# Patient Record
Sex: Male | Born: 1990 | Race: White | Hispanic: No | Marital: Single | Smoking: Former smoker
Health system: Southern US, Community
[De-identification: ages and names within clinical notes are randomized; demographics above are authoritative.]

## PROBLEM LIST (undated history)

## (undated) DIAGNOSIS — J45909 Unspecified asthma, uncomplicated: Secondary | ICD-10-CM

## (undated) DIAGNOSIS — J189 Pneumonia, unspecified organism: Secondary | ICD-10-CM

## (undated) HISTORY — PX: CHOLECYSTECTOMY: SHX55

## (undated) HISTORY — PX: OTHER SURGICAL HISTORY: SHX169

---
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
## 2008-01-19 ENCOUNTER — Encounter: Payer: Self-pay | Admitting: Family Medicine

## 2008-01-19 DIAGNOSIS — F909 Attention-deficit hyperactivity disorder, unspecified type: Secondary | ICD-10-CM | POA: Insufficient documentation

## 2008-01-19 DIAGNOSIS — D580 Hereditary spherocytosis: Secondary | ICD-10-CM

## 2008-01-19 DIAGNOSIS — L259 Unspecified contact dermatitis, unspecified cause: Secondary | ICD-10-CM

## 2008-01-20 ENCOUNTER — Ambulatory Visit: Payer: Self-pay | Admitting: Family Medicine

## 2008-01-20 DIAGNOSIS — J45909 Unspecified asthma, uncomplicated: Secondary | ICD-10-CM | POA: Insufficient documentation

## 2008-01-20 LAB — CONVERTED CEMR LAB
Bilirubin Urine: NEGATIVE
Blood in Urine, dipstick: NEGATIVE
Glucose, Urine, Semiquant: NEGATIVE
Ketones, urine, test strip: NEGATIVE
Nitrite: NEGATIVE
Protein, U semiquant: NEGATIVE
Specific Gravity, Urine: 1.015
Urobilinogen, UA: NEGATIVE
WBC Urine, dipstick: NEGATIVE
pH: 6.5

## 2009-03-29 ENCOUNTER — Ambulatory Visit: Payer: Self-pay | Admitting: Family Medicine

## 2009-03-29 DIAGNOSIS — H6690 Otitis media, unspecified, unspecified ear: Secondary | ICD-10-CM | POA: Insufficient documentation

## 2009-04-07 ENCOUNTER — Emergency Department (HOSPITAL_COMMUNITY): Admission: EM | Admit: 2009-04-07 | Discharge: 2009-04-07 | Payer: Self-pay | Admitting: Emergency Medicine

## 2010-02-17 IMAGING — CR DG ANKLE COMPLETE 3+V*L*
3 series · 3 of 3 positions shown · non-contrast
Comparison: None.

CLINICAL DATA: Lateral left ankle pain following injury.

LEFT ANKLE COMPLETE - 3+ VIEW

[t ankle joint ap left]
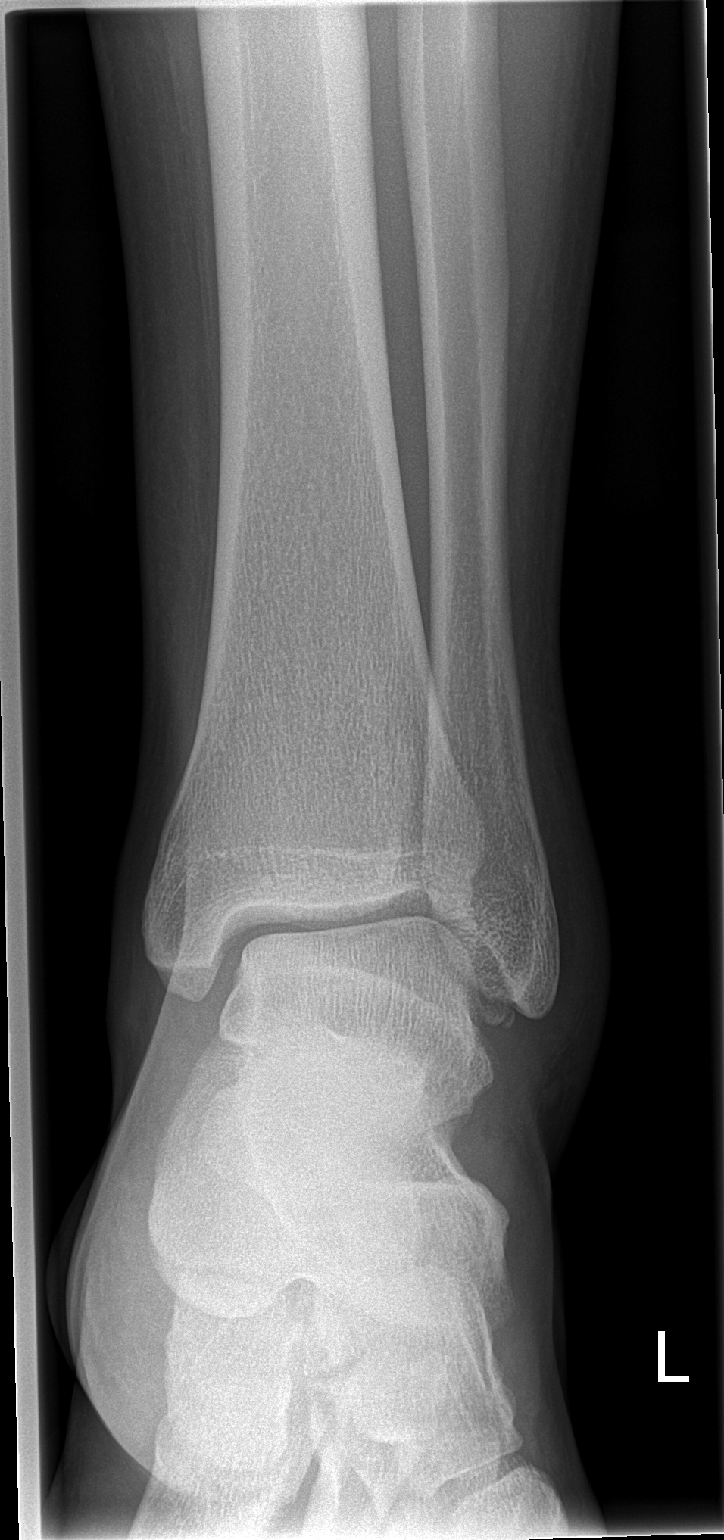

[t ankle joint oblique left]
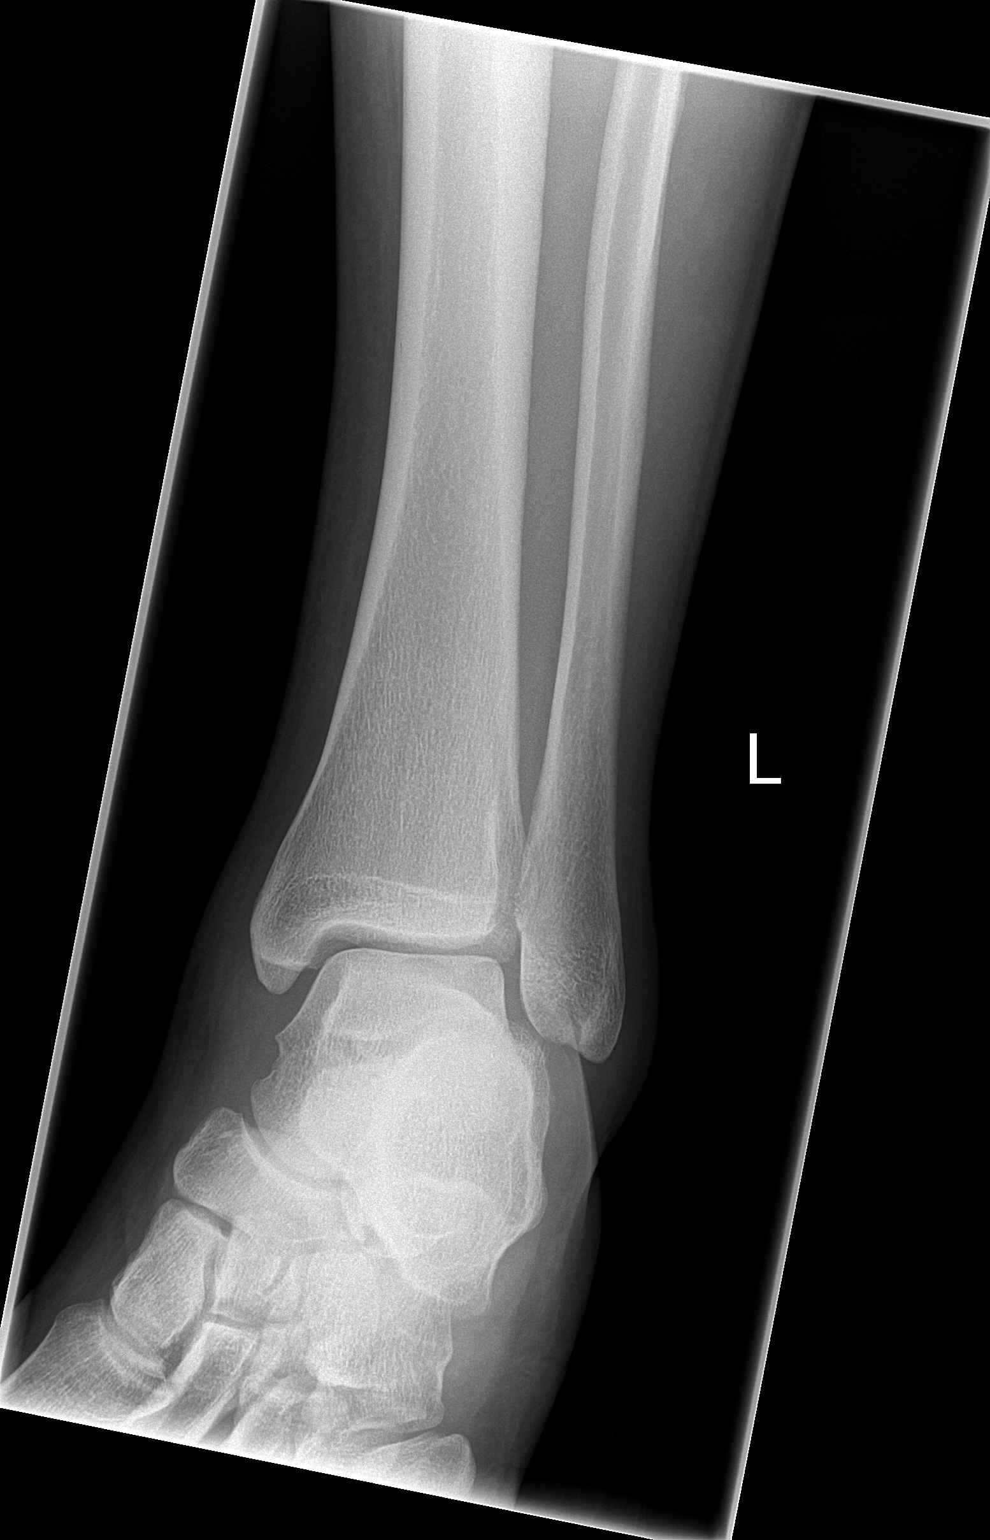

[t ankle joint lat left]
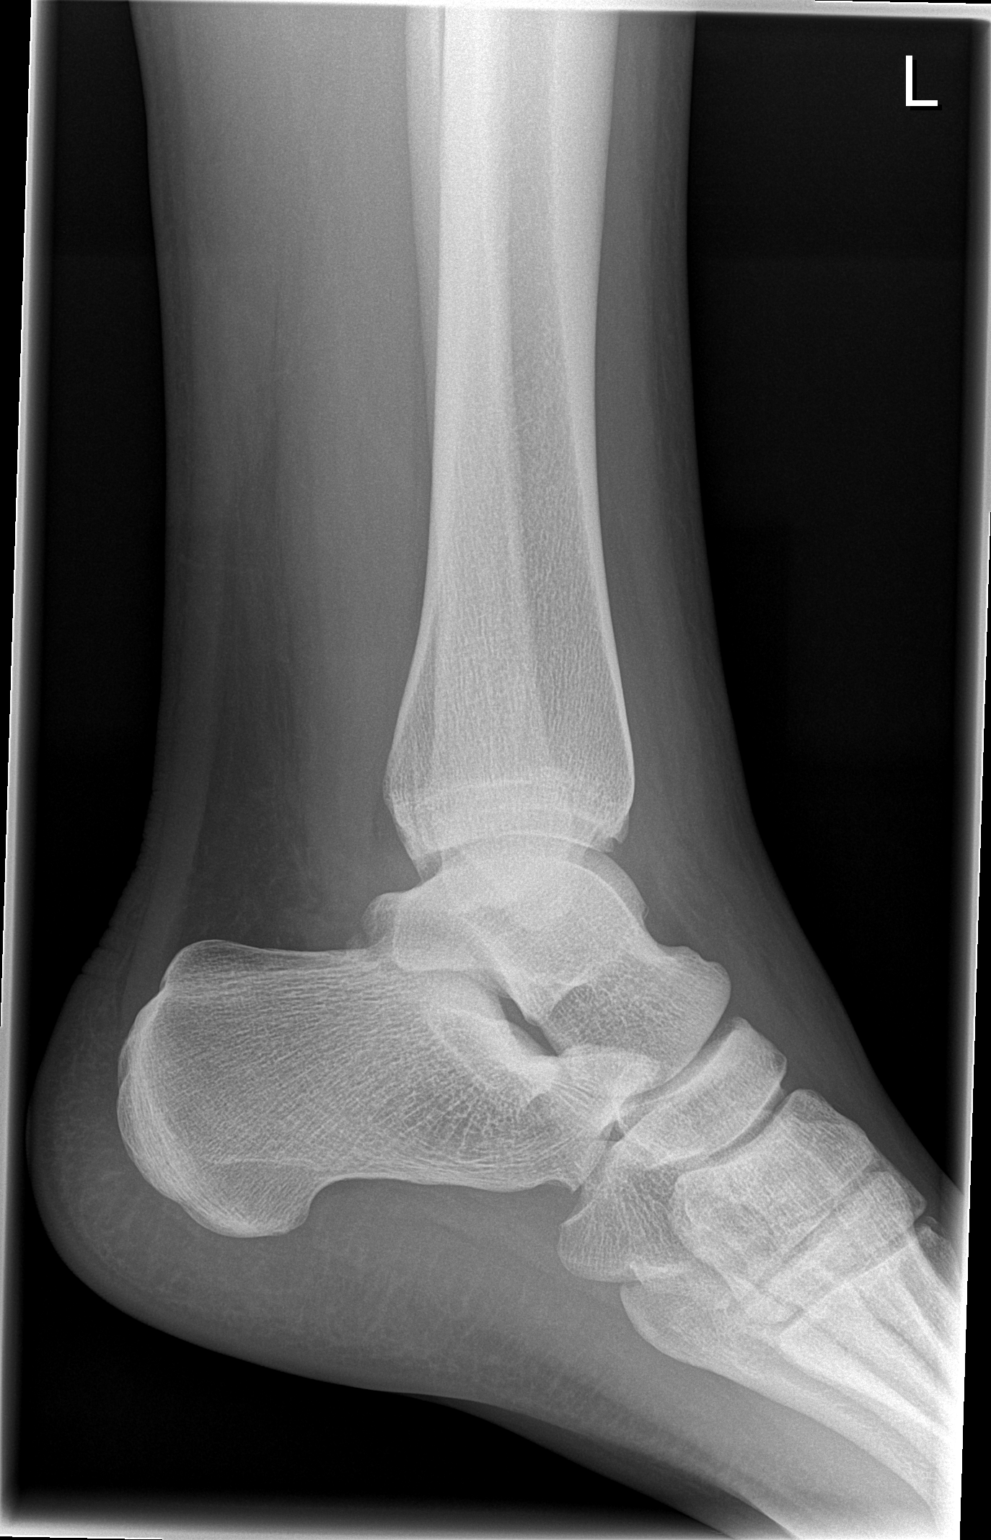

[3 of 3 positions shown; findings below may reference images not displayed]

FINDINGS: Small avulsion fracture off the distal aspect of the
lateral malleolus.  Lateral soft tissue swelling.  No definite
effusion.
IMPRESSION: Small avulsion fracture off the distal aspect of the lateral
malleolus.

## 2010-08-05 ENCOUNTER — Emergency Department (HOSPITAL_COMMUNITY): Admission: EM | Admit: 2010-08-05 | Discharge: 2010-08-06 | Payer: Self-pay | Admitting: Emergency Medicine

## 2010-08-06 ENCOUNTER — Telehealth: Payer: Self-pay | Admitting: Family Medicine

## 2010-12-31 NOTE — Progress Notes (Signed)
Summary: call a nurse  Phone Note Call from Patient   Summary of Call: Triage Record Num: [id number] Operator: Sheryn Bison Patient Name: Ripken Rekowski Call Date & Time: 08/05/2010 7:36:54PM Patient Phone: [phone] PCP: Patient Gender: Male PCP Fax : Patient DOB: 02/27/1991 Practice Name: [facility] Reason for Call: Pt is calling about experiencing a stiff neck, weakness, aching and the left palm of his hand is swollen. No fever. Rn advised ED. Protocol(s) Used: Neck Pain Or Stiffness (Pediatric) Recommended Outcome per Protocol: See ED Immediately Reason for Outcome: Weakness (loss of strength) in the arms or legs Care Advice:  ~ CARE ADVICE given per Neck Pain or Stiffness (Pediatric) guideline. GO TO ED NOW Your child needs to be seen within the next hour. Go to the [facility] at _[facility] Hospital. Leave as soon as you can.  ~ 08/05/2010 7:44:04PM Page 1 of 1 [id number] Initial call taken by: Melody Comas,  August 06, 2010 10:50 AM

## 2011-02-13 ENCOUNTER — Encounter (INDEPENDENT_AMBULATORY_CARE_PROVIDER_SITE_OTHER): Payer: Self-pay | Admitting: *Deleted

## 2011-02-13 ENCOUNTER — Encounter: Payer: Self-pay | Admitting: Family Medicine

## 2011-02-13 ENCOUNTER — Ambulatory Visit (INDEPENDENT_AMBULATORY_CARE_PROVIDER_SITE_OTHER): Payer: BC Managed Care – PPO | Admitting: Family Medicine

## 2011-02-13 DIAGNOSIS — H60339 Swimmer's ear, unspecified ear: Secondary | ICD-10-CM

## 2011-02-13 DIAGNOSIS — R519 Headache, unspecified: Secondary | ICD-10-CM | POA: Insufficient documentation

## 2011-02-13 DIAGNOSIS — R51 Headache: Secondary | ICD-10-CM

## 2011-02-13 DIAGNOSIS — H66019 Acute suppurative otitis media with spontaneous rupture of ear drum, unspecified ear: Secondary | ICD-10-CM

## 2011-02-18 NOTE — Letter (Signed)
Summary: Out of Work  [facility]  [address] [place], [place] [address]   Phone: [phone]  Fax: [phone]    February 13, 2011   Employee:  Frank Bell    To Whom It May Concern:   For Medical reasons, please excuse the above named employee from work for the following dates:  Start:   February 12 2011    End:   May return to work on March 19th 2012  If you need additional information, please feel free to contact our office.         Sincerely,  Hannah Beat MD

## 2011-02-18 NOTE — Assessment & Plan Note (Signed)
Summary: EAR/CLE  [employer]   Vital Signs:  Patient profile:   20 year old male Height:      75 inches Weight:      267.25 pounds BMI:     33.52 Temp:     97.9 degrees F oral Pulse rate:   64 / minute Pulse rhythm:   regular BP sitting:   110 / 60  (left arm) Cuff size:   large  Vitals Entered By: Benny Lennert CMA Duncan Dull) (February 13, 2011 8:04 AM)  History of Present Illness: Chief complaint ear ache and headache  20 year old male:  OM, OE: Having some ear problems, right ear has historically had some problems and right ear has been causing him a lot of probs. He has had some drainage and signficant pain from the R > L ear. Has had drainage from his R ear in the past, too. Taken some tylenol.   Couple weeks ago, had a cold also has daily stuffy noses  HA: Normally does not have HA Now with bad HA, had to miss work yesterday no n/v/d. no photophobia or phonophobia.  Allergies (verified): No Known Drug Allergies  Past History:  Past medical, surgical, family and social histories (including risk factors) reviewed, and no changes noted (except as noted below).  Past Medical History: Reviewed history from 01/20/2008 and no changes required. 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 Cholecystectomy Splenectomy ear tubes placed age 8   Family History: Reviewed history from 01/19/2008 and no changes required. Father: Alive 13, healthy, psoriasis Mother: Alive 20, HTN Siblings: 2 brothers, 63 and 74 YOA MGG:  CVA, CABG CV:  (+)  HBP:  (+) strong family hx. Arthritis:  (+) Pancreatic CA:  MGM  Social History: Reviewed history from 01/20/2008 and no changes required. 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 CV: No chest pain or SOB GI: No noted N or V Otherwise, pertinent positives and negatives are noted in the HPI.   Physical Exam  General:  Well-developed,well-nourished,in no acute distress; alert,appropriate and cooperative throughout examination Head:  Normocephalic and atraumatic without obvious abnormalities. No apparent alopecia or balding. Ears:  R TM, small perf, cloudy, yellowish, cannot see landmarks. R ear painful when moved and tragus painful when moved. L ear with serous fluid Nose:  no external deformity.   Mouth:  Oral mucosa and oropharynx without lesions or exudates.  Teeth in good repair. Neck:  No deformities, masses, or tenderness noted. Lungs:  Normal respiratory effort, chest expands symmetrically. Lungs are clear to auscultation, no crackles or wheezes. Heart:  Normal rate and regular rhythm. S1 and S2 normal without gallop, murmur, click, rub or other extra sounds. Extremities:  No clubbing, cyanosis, edema, or deformity noted with normal full range of motion of all joints.   Neurologic:  alert & oriented X3 and gait normal.   Cervical Nodes:  + ant LAD Psych:  Cognition and judgment appear intact. Alert and cooperative with normal attention span and concentration. No apparent delusions, illusions, hallucinations   Impression & Recommendations:  Problem # 1:  OTITIS MEDIA, ACUTE WITH RUPTURE OF TYMPANIC MEMBRANE (ICD-382.01) Assessment New bad OM, perf likely old high dose amox with floxin otic -- suspect OE as well by exam  The following medications were removed from the medication list:    Amoxicillin 500 Mg Tabs (Amoxicillin) .Marland Kitchen... 2 tab by mouth two times a day x 10 days His updated medication list for this problem includes:    Amoxicillin  500 Mg Tabs (Amoxicillin) .Marland KitchenMarland KitchenMarland KitchenMarland Kitchen 3 tabs by mouth two times a day (high dose om)  Problem # 2:  OTITIS EXTERNA, ACUTE, RIGHT (ICD-380.12) Assessment: New  Problem # 3:  HEADACHE (ICD-784.0) most likely related to #1  His updated medication list for this problem includes:    Butalbital-apap-caffeine 50-325-40 Mg Tabs (Butalbital-apap-caffeine) .Marland Kitchen... 1 - 2 by mouth q 4 hours as needed headache (max 6 a day)  Complete Medication List: 1)  Ofloxacin 0.3 % Soln (Ofloxacin) .Marland Kitchen.. 10 drops each day for 10 days 2)  Amoxicillin 500 Mg Tabs (Amoxicillin) .... 3 tabs by mouth two times a day (high dose om) 3)  Butalbital-apap-caffeine 50-325-40 Mg Tabs (Butalbital-apap-caffeine) .Marland Kitchen.. 1 - 2 by mouth q 4 hours as needed headache (max 6 a day) Prescriptions: BUTALBITAL-APAP-CAFFEINE 50-325-40 MG TABS (BUTALBITAL-APAP-CAFFEINE) 1 - 2 by mouth q 4 hours as needed headache (max 6 a day)  #30 x 0   Entered and Authorized by:   Hannah Beat MD   Signed by:   Hannah Beat MD on 02/13/2011   Method used:   Print then Give to Patient   RxID:   [id number] AMOXICILLIN 500 MG TABS (AMOXICILLIN) 3 tabs by mouth two times a day (high dose OM)  #60 x 0   Entered and Authorized by:   Hannah Beat MD   Signed by:   Hannah Beat MD on 02/13/2011   Method used:   Print then Give to Patient   RxID:   [id number] OFLOXACIN 0.3 % SOLN (OFLOXACIN) 10 drops each day for 10 days  #1 x 0   Entered and Authorized by:   Hannah Beat MD   Signed by:   Hannah Beat MD on 02/13/2011   Method used:   Print then Give to Patient   RxID:   [id number]    Orders Added: 1)  Est. Patient Level IV [53664]    Prior Medications (reviewed today): None Current Allergies (reviewed today): No known allergies

## 2011-04-18 NOTE — Assessment & Plan Note (Signed)
[facility]                             [place] OFFICE NOTE   Frank Bell, Frank Bell                     MRN:          [id number]  DATE:06/29/2006                            DOB:          Feb 20, 1991    CHIEF COMPLAINT:  A 20 year old white male here to establish a new doctor.   HISTORY OF PRESENT ILLNESS:  Frank Bell has been having three to four days  of fever, severe sore throat, swollen glands, increased mucus production.  He also says he has some right ear pain and right external neck pain.  He  denies cough or shortness of breath.  He is having difficulty swallowing.  He does have a history of occasional nose bleeds.   PAST MEDICAL HISTORY:  1.  Hereditary xerocytosis.  2.  Childhood asthma, now resolved.  3.  Tobacco abuse, dips tobacco.  4.  Eczema.   HOSPITALIZATIONS AND SURGERIES:  1.  Cholecystectomy.  2.  Splenectomy.   ALLERGIES:  None.   MEDICATIONS:  None.   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  He  drives bulldozers.  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  He is not married and does not have any  children.  He occasionally gets exercise in the form of swimming.  For his  diet, he mainly eats fast food with only occasional vegetables and skips  lunch frequently.  He has past history of tobacco abuse, one and a half  packs per day, but stopped and replaced it with dipping.  He dips tobacco  once daily. He denies alcohol and IV drug use.   FAMILY HISTORY:  Father alive at age 43, healthy but with psoriasis.  Mother  age 62 with hypertension.  Maternal grandmother passed away from CVA and  also had coronary artery bypass graft.  Frank Bell has two brothers, one who is  27 and one who is 66 years old.  His younger brother had gastroschisis as a  child.  There is a strong family history for cardiovascular disease,  hypertension, and diabetes, as  well as arthritis.  His maternal grandmother  also had pancreatic cancer.   IMMUNIZATIONS:  As far as his mother knows, he is up to date on  immunizations, although she does not think he has received Pneumovax or  meningococcal vaccine.   PHYSICAL EXAMINATION:  VITAL SIGNS:  Stable with slight elevation in  temperature at 100.1.  GENERAL:  Overweight-appearing male in no apparent distress.  Nontoxic  appearing.  No increased work of breathing.  HEENT:  PERRLA.  Extraocular muscles intact.  Tympanic membranes bilaterally  scarred and nonmobile.  No clear exudate or erythema bilaterally.  Nares  patent.  Oropharynx:  Enlarged tonsils with large crypts bilaterally.  Right  tonsil larger than left with uvular deviation toward the right.  Lymphadenopathy bilaterally but right significantly larger than left.  The  patient is talking with somewhat a rounded voice.  CARDIOVASCULAR:  Regular rate and rhythm.  No murmurs, rubs,  or gallops.  PULMONARY:  Clear to auscultation bilaterally.  No wheezes, rales, or  rhonchi.   ASSESSMENT AND PLAN:  Concern for peritonsillar abscess on the right.  Discussed the patient's case and history of splenectomy and hereditary  xerocytosis.  The patient was sent to ears, nose and throat doctor for an  appointment today at 1 p.m.  Before leaving, we obtained a CBC with diff,  mono spot, rapid strep, and throat culture.  He will follow up with me after  seeing Dr. Dorma Bell p.r.n.  We did discuss that at his leisure, he can return  to be considered for meningococcal and Pneumovax vaccines, as well as to  receive influenza vaccine in the indicated season.                                   Kerby Nora, MD   AB/MedQ  DD:  06/29/2006  DT:  06/30/2006  Job #:  [id number]

## 2011-07-16 ENCOUNTER — Inpatient Hospital Stay (INDEPENDENT_AMBULATORY_CARE_PROVIDER_SITE_OTHER)
Admission: RE | Admit: 2011-07-16 | Discharge: 2011-07-16 | Disposition: A | Discharge status: Home / Self Care | Payer: Self-pay | Source: Ambulatory Visit | Attending: Emergency Medicine | Admitting: Emergency Medicine

## 2011-07-16 DIAGNOSIS — S90929A Unspecified superficial injury of unspecified foot, initial encounter: Secondary | ICD-10-CM

## 2011-07-21 ENCOUNTER — Encounter: Payer: Self-pay | Admitting: Family Medicine

## 2011-07-21 DIAGNOSIS — Z029 Encounter for administrative examinations, unspecified: Secondary | ICD-10-CM

## 2011-07-24 ENCOUNTER — Encounter: Payer: Self-pay | Admitting: Family Medicine

## 2013-07-26 ENCOUNTER — Emergency Department (HOSPITAL_COMMUNITY)
Admission: EM | Admit: 2013-07-26 | Discharge: 2013-07-26 | Disposition: A | Discharge status: Home / Self Care | Payer: BC Managed Care – PPO | Attending: Emergency Medicine | Admitting: Emergency Medicine

## 2013-07-26 ENCOUNTER — Encounter: Payer: Self-pay | Admitting: Family Medicine

## 2013-07-26 ENCOUNTER — Ambulatory Visit (INDEPENDENT_AMBULATORY_CARE_PROVIDER_SITE_OTHER): Payer: BC Managed Care – PPO | Admitting: Family Medicine

## 2013-07-26 ENCOUNTER — Emergency Department (HOSPITAL_COMMUNITY): Payer: BC Managed Care – PPO

## 2013-07-26 ENCOUNTER — Encounter (HOSPITAL_COMMUNITY): Payer: Self-pay | Admitting: Emergency Medicine

## 2013-07-26 VITALS — BP 108/72 | HR 56 | Temp 97.8°F | Ht 75.0 in | Wt 270.2 lb

## 2013-07-26 DIAGNOSIS — M545 Low back pain, unspecified: Secondary | ICD-10-CM | POA: Insufficient documentation

## 2013-07-26 DIAGNOSIS — Z87891 Personal history of nicotine dependence: Secondary | ICD-10-CM | POA: Insufficient documentation

## 2013-07-26 DIAGNOSIS — J45909 Unspecified asthma, uncomplicated: Secondary | ICD-10-CM | POA: Insufficient documentation

## 2013-07-26 DIAGNOSIS — M549 Dorsalgia, unspecified: Secondary | ICD-10-CM

## 2013-07-26 HISTORY — DX: Unspecified asthma, uncomplicated: J45.909

## 2013-07-26 MED ORDER — TRAMADOL HCL 50 MG PO TABS: 50.0000 mg | ORAL_TABLET | Freq: Three times a day (TID) | ORAL | Status: DC | PRN

## 2013-07-26 MED ORDER — CYCLOBENZAPRINE HCL 10 MG PO TABS: 10.0000 mg | ORAL_TABLET | Freq: Three times a day (TID) | ORAL | Status: DC | PRN

## 2013-07-26 NOTE — Progress Notes (Signed)
Subjective:    Patient ID: Frank Bell, male    DOB: 12/15/1990, 22 y.o.   MRN: [id number]  HPI Here with back pain - for over 2 weeks    L lower back back pain - dull throughout the day and gets sharp with certain movements  Pain does not shoot down his leg  Recommended follow up for this  He tries different positions / and cannot sleep well due to pain   Has flexeril  Mother gave him some of her percocet -- 2-3 per day (has done that on and off for past week or so)  Takes ibuprofen 800 mg three times per day-not much help from that   He had some left over hydrocodone from a fx ankle in the past -- but they make him nauseated   Only thing that gets him through work is percocet  He just wants pain medicine at this time= and thinks this will get better on its own  Works in a marine shop lot of manual labor    Was seen in the ER for this was there at 2:30 am  Had xray -upon review it is normal Pt states the ER doctor told him he had a bulging disc with a pinched nerve and he needed to get pain med from his PCP    Patient Active Problem List   Diagnosis Date Noted  . OTITIS EXTERNA, ACUTE, RIGHT 02/13/2011  . OTITIS MEDIA, ACUTE WITH RUPTURE OF TYMPANIC MEMBRANE 02/13/2011  . HEADACHE 02/13/2011  . LOM 03/29/2009  . ASTHMA 01/20/2008  . 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 ADHD 01/19/2008  . ECZEMA 01/19/2008   Past Medical History  Diagnosis Date  . Asthma    Past Surgical History  Procedure Laterality Date  . Cholecystectomy    . Spleenectomy     History  Substance Use Topics  . Smoking status: Former Games developer  . Smokeless tobacco: Not on file  . Alcohol Use: Yes     Comment: occ   No family history on file. No Known Allergies Current Outpatient Prescriptions on File Prior to Visit  Medication Sig Dispense Refill  . cyclobenzaprine (FLEXERIL) 10 MG tablet Take 1 tablet (10 mg total) by mouth 3 (three) times daily as needed for muscle spasms.  30  tablet  0   No current facility-administered medications on file prior to visit.      Review of Systems Review of Systems  Constitutional: Negative for fever, appetite change, fatigue and unexpected weight change.  Eyes: Negative for pain and visual disturbance.  Respiratory: Negative for cough and shortness of breath.   Cardiovascular: Negative for cp or palpitations    Gastrointestinal: Negative for nausea, diarrhea and constipation.  Genitourinary: Negative for urgency and frequency. neg for blood in urine or dysuria  MSK pos for lumbar pain without radiation Skin: Negative for pallor or rash   Neurological: Negative for weakness, light-headedness, numbness and headaches.  Hematological: Negative for adenopathy. Does not bruise/bleed easily.  Psychiatric/Behavioral: Negative for dysphoric mood. The patient is not nervous/anxious.         Objective:   Physical Exam  Constitutional: He appears well-developed and well-nourished. No distress.  overwt and well appearing   HENT:  Head: Normocephalic and atraumatic.  Eyes: Conjunctivae and EOM are normal. Pupils are equal, round, and reactive to light.  Neck: Normal range of motion. Neck supple.  Cardiovascular: Normal rate and regular rhythm.   Pulmonary/Chest: Effort normal and breath sounds normal.  Musculoskeletal:  He exhibits tenderness. He exhibits no edema.       Lumbar back: He exhibits decreased range of motion, tenderness and spasm. He exhibits no bony tenderness, no swelling, no edema and no deformity.  Tender in L lumbar musculature and SI area  No bony tenderness Neg SLR Nl rom hips LS flex 30 deg and ext 10 deg with pain  Some pain on lateral bend and twist   No neuol signs   Lymphadenopathy:    He has no cervical adenopathy.  Neurological: He is alert. He has normal reflexes. He displays no atrophy. No sensory deficit. He exhibits normal muscle tone. Coordination and gait normal.  Skin: Skin is warm and dry. No  rash noted. No erythema. No pallor.  Psychiatric: He has a normal mood and affect.          Assessment & Plan:

## 2013-07-26 NOTE — ED Notes (Addendum)
Pt states he has had back pain x2 weeks. Pt reports he cannot determine a single event that caused the pain. Pt reports his pain is in the lumbar area of the spine, but more on the left lower side of his back. Pt has been taking vicodin and percocet from previous prescriptions not related to his back for his pain. Pt reports experiencing issues with mobility when he has the pain. Pt describes the pain as sharp, but does not radiate down his leg.

## 2013-07-26 NOTE — ED Notes (Signed)
Pt transported to XR.  

## 2013-07-26 NOTE — Assessment & Plan Note (Addendum)
Pt seen in ER last night - rev xray which was normal  Suspect muscle spasm - but pt is worried about disc pathology He asks for percocet today because he was using a family member's medicine and it is the only thing "that works" I ref him to orthopedics for further eval  Given short course of ultram for pain and inst to use flexeril and nsaid if needed with caution of sedation  Disc use of heat on back and usefulness of walking  Handout given  Disc neuro s/s to watch for  >25 min spent with face to face with patient, >50% counseling and/or coordinating care

## 2013-07-26 NOTE — ED Provider Notes (Signed)
CSN: [id number]     Arrival date & time 07/26/13  7829 History   First MD Initiated Contact with Patient 07/26/13 0440     Chief Complaint  Patient presents with  . Back Pain   HPI  History provided by the patient. The patient is a 22 year old male with asthma presents with complaints of continued and worsened low back pain. Patient states he has had fairly persistent low back pain for last 2 weeks. It is greater on the left side but in the middle of the back radiating to the left buttocks and thigh. He states it is difficult at times to walk and do normal activities. 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 He denies having any specific injury or trauma however. He does mention occasionally having similar pains to his low back in the past but they usually lasted a few days. He has been seen previously for this and taking pain medications and muscle relaxer. He states nothing is helping. He denies any urinary or fecal incontinence, urinary retention or perineal numbness. No flank pain or hematuria. No nausea vomiting. No fever, chills or sweats. No other aggravating or alleviating factors. No other associated symptoms.    Past Medical History  Diagnosis Date  . Asthma    Past Surgical History  Procedure Laterality Date  . Cholecystectomy    . Spleenectomy     No family history on file. History  Substance Use Topics  . Smoking status: Former Games developer  . Smokeless tobacco: Not on file  . Alcohol Use: Yes    Review of Systems  Genitourinary: Negative for dysuria, frequency, hematuria and flank pain.  Musculoskeletal: Positive for back pain.  Neurological: Negative for weakness and numbness.  All other systems reviewed and are negative.    Allergies  Review of patient's allergies indicates no known allergies.  Home Medications  No current outpatient prescriptions on file. BP 120/82  Pulse 68  Temp(Src) 97.7 F (36.5 C) (Oral)   SpO2 96% Physical Exam  Nursing note and vitals reviewed. Constitutional: He is oriented to person, place, and time. He appears well-developed and well-nourished. No distress.  HENT:  Head: Normocephalic.  Eyes: Conjunctivae are normal.  Neck: Normal range of motion. Neck supple.  Cardiovascular: Normal rate and regular rhythm.   Pulmonary/Chest: Effort normal and breath sounds normal. No respiratory distress. He has no wheezes. He has no rales.  Abdominal: Soft. There is no tenderness. There is no rebound and no guarding.  Musculoskeletal: Normal range of motion.       Lumbar back: He exhibits tenderness. He exhibits no bony tenderness, no swelling and no deformity.       Back:  Neurological: He is alert and oriented to person, place, and time. He has normal strength. No sensory deficit. Gait normal.  Negative straight leg test bilaterally  Skin: Skin is warm.  Psychiatric: He has a normal mood and affect. His behavior is normal.    ED Course  Procedures  Labs Review Labs Reviewed - No data to display Imaging Review Dg Lumbar Spine Complete  07/26/2013   *RADIOLOGY REPORT*  Clinical Data: Low back pain  LUMBAR SPINE - COMPLETE 4+ VIEW  Comparison: None.  Findings: Gentle leftward curvature may be accentuated by positioning. The imaged vertebral bodies and inter-vertebral disc spaces are maintained. No displaced acute fracture or dislocation identified.   The para-vertebral and overlying soft tissues are within normal limits.  Bilateral upper quadrant surgical  clips.  IMPRESSION: No acute or aggressive osseous abnormality lumbar spine.   Original Report Authenticated By: Jearld Lesch, M.D.    MDM   1. Back pain     5:10AM patient seen and evaluated. X-rays unremarkable. Negative straight leg test. No concerning or red flag symptoms. This time we'll recommend continue syndromatic treatment and followup with his PCP for continued evaluation and treatment.    Angus Seller,  PA-C 07/27/13 0023

## 2013-07-26 NOTE — Patient Instructions (Addendum)
Take flexeril as needed- caution of sedation Stop percocet Try tramadol for pain as needed - again caution of sedation Try heat on your back  Continue ibuprofen as needed (take it with food) We will do orthopedic referral at check out

## 2013-07-26 NOTE — ED Notes (Signed)
PT. REPORTS LOW BACK PAIN FOR 2 WEEKS DENIES HEMATURIA OR DYSURIA , NO RECENT FALL OR INJURY , AMBULATORY.

## 2013-07-27 NOTE — ED Provider Notes (Signed)
Medical screening examination/treatment/procedure(s) were performed by non-physician practitioner and as supervising physician I was immediately available for consultation/collaboration.  Jasmine Awe, MD 07/27/13 [phone]

## 2016-02-16 ENCOUNTER — Encounter: Payer: Self-pay | Admitting: Emergency Medicine

## 2016-02-16 ENCOUNTER — Emergency Department
Admission: EM | Admit: 2016-02-16 | Discharge: 2016-02-17 | Disposition: A | Discharge status: Home / Self Care | Payer: Self-pay | Attending: Emergency Medicine | Admitting: Emergency Medicine

## 2016-02-16 ENCOUNTER — Emergency Department: Payer: Self-pay

## 2016-02-16 DIAGNOSIS — R000 Tachycardia, unspecified: Secondary | ICD-10-CM | POA: Insufficient documentation

## 2016-02-16 DIAGNOSIS — J40 Bronchitis, not specified as acute or chronic: Secondary | ICD-10-CM

## 2016-02-16 DIAGNOSIS — R509 Fever, unspecified: Secondary | ICD-10-CM

## 2016-02-16 DIAGNOSIS — M791 Myalgia, unspecified site: Secondary | ICD-10-CM

## 2016-02-16 DIAGNOSIS — Z87891 Personal history of nicotine dependence: Secondary | ICD-10-CM | POA: Insufficient documentation

## 2016-02-16 DIAGNOSIS — Z79899 Other long term (current) drug therapy: Secondary | ICD-10-CM | POA: Insufficient documentation

## 2016-02-16 DIAGNOSIS — J209 Acute bronchitis, unspecified: Secondary | ICD-10-CM | POA: Insufficient documentation

## 2016-02-16 HISTORY — DX: Pneumonia, unspecified organism: J18.9

## 2016-02-16 LAB — COMPREHENSIVE METABOLIC PANEL
ALT: 40 U/L (ref 17–63)
AST: 54 U/L — ABNORMAL HIGH (ref 15–41)
Albumin: 4.3 g/dL (ref 3.5–5.0)
Alkaline Phosphatase: 35 U/L — ABNORMAL LOW (ref 38–126)
Anion gap: 8 (ref 5–15)
BILIRUBIN TOTAL: 0.8 mg/dL (ref 0.3–1.2)
BUN: 11 mg/dL (ref 6–20)
CHLORIDE: 100 mmol/L — AB (ref 101–111)
CO2: 26 mmol/L (ref 22–32)
CREATININE: 1.15 mg/dL (ref 0.61–1.24)
Calcium: 8.7 mg/dL — ABNORMAL LOW (ref 8.9–10.3)
Glucose, Bld: 113 mg/dL — ABNORMAL HIGH (ref 65–99)
Potassium: 4 mmol/L (ref 3.5–5.1)
Sodium: 134 mmol/L — ABNORMAL LOW (ref 135–145)
TOTAL PROTEIN: 7.5 g/dL (ref 6.5–8.1)

## 2016-02-16 LAB — CBC WITH DIFFERENTIAL/PLATELET
BASOS ABS: 0 10*3/uL (ref 0–0.1)
BLASTS: 0 %
Band Neutrophils: 2 %
Basophils Relative: 0 %
Eosinophils Absolute: 0.2 10*3/uL (ref 0–0.7)
Eosinophils Relative: 1 %
HCT: 52.9 % — ABNORMAL HIGH (ref 40.0–52.0)
Hemoglobin: 18.7 g/dL — ABNORMAL HIGH (ref 13.0–18.0)
LYMPHS ABS: 0.9 10*3/uL — AB (ref 1.0–3.6)
Lymphocytes Relative: 5 %
MCH: 31.8 pg (ref 26.0–34.0)
MCHC: 35.3 g/dL (ref 32.0–36.0)
MCV: 90.1 fL (ref 80.0–100.0)
METAMYELOCYTES PCT: 0 %
MYELOCYTES: 0 %
Monocytes Absolute: 2.7 10*3/uL — ABNORMAL HIGH (ref 0.2–1.0)
Monocytes Relative: 15 %
NEUTROS PCT: 77 %
NRBC: 0 /100{WBCs}
Neutro Abs: 13.9 10*3/uL — ABNORMAL HIGH (ref 1.4–6.5)
Other: 0 %
PLATELETS: 366 10*3/uL (ref 150–440)
PROMYELOCYTES ABS: 0 %
RBC: 5.87 MIL/uL (ref 4.40–5.90)
RDW: 13.7 % (ref 11.5–14.5)
WBC: 17.7 10*3/uL — AB (ref 3.8–10.6)

## 2016-02-16 LAB — LACTIC ACID, PLASMA: LACTIC ACID, VENOUS: 1.7 mmol/L (ref 0.5–2.0)

## 2016-02-16 LAB — RAPID INFLUENZA A&B ANTIGENS (ARMC ONLY): INFLUENZA A (ARMC): NEGATIVE

## 2016-02-16 LAB — RAPID INFLUENZA A&B ANTIGENS: Influenza B (ARMC): NEGATIVE

## 2016-02-16 MED ORDER — SODIUM CHLORIDE 0.9 % IV BOLUS (SEPSIS)
1000.0000 mL | Freq: Once | INTRAVENOUS | Status: AC
  Administered 2016-02-16: 1000 mL via INTRAVENOUS

## 2016-02-16 MED ORDER — KETOROLAC TROMETHAMINE 30 MG/ML IJ SOLN
30.0000 mg | Freq: Once | INTRAMUSCULAR | Status: AC
  Administered 2016-02-16: 30 mg via INTRAVENOUS
  Filled 2016-02-16: qty 1

## 2016-02-16 NOTE — ED Notes (Signed)
Pt presents to ED with flu like symptoms of cough, fever, congestion, headache, and body aches for the past week. Pt has been taking otc fever reducers and left over antibiotics from the last time he had a tooth pulled, but states he is not feeling better. Frequent dry cough during triage.

## 2016-02-17 MED ORDER — SODIUM CHLORIDE 0.9 % IV BOLUS (SEPSIS)
1000.0000 mL | Freq: Once | INTRAVENOUS | Status: AC
  Administered 2016-02-17: 1000 mL via INTRAVENOUS

## 2016-02-17 MED ORDER — AMOXICILLIN-POT CLAVULANATE 875-125 MG PO TABS: 1.0000 | ORAL_TABLET | Freq: Two times a day (BID) | ORAL | Status: AC

## 2016-02-17 MED ORDER — HYDROCOD POLST-CPM POLST ER 10-8 MG/5ML PO SUER
5.0000 mL | Freq: Two times a day (BID) | ORAL | Status: DC
Start: 2016-02-17 — End: 2023-05-24

## 2016-02-17 MED ORDER — DEXTROSE 5 % IV SOLN
1.0000 g | Freq: Once | INTRAVENOUS | Status: AC
  Administered 2016-02-17: 1 g via INTRAVENOUS
  Filled 2016-02-17: qty 10

## 2016-02-17 NOTE — ED Notes (Signed)
Rapid Strep: negative

## 2016-02-17 NOTE — ED Notes (Signed)
Pt discharged to home.  Family member driving.  Discharge instructions reviewed.  Verbalized understanding.  No questions or concerns at this time.  Teach back verified.  Pt in NAD.  No items left in ED.   

## 2016-02-17 NOTE — ED Provider Notes (Signed)
[facility] Emergency Department Provider Note  ____________________________________________  Time seen: Approximately 2328 PM  I have reviewed the triage vital signs and the nursing notes.   HISTORY  Chief Complaint Fever; Cough; Headache; and Generalized Body Aches    HPI Frank Bell is a 25 y.o. male who comes into the hospital today with fever, cough and body aches. The patient reports that he feels weak all over. He reports that he's had fevers up to 104 and he can't sleep. He is having some chills and he reports that the fever keeps going up. The patient started taking some amoxicillin which he takes whenever he goes to the dentist but he has not gotten any better. He reports that he's also taken some Mucinex and Tylenol. He's had these symptoms since last week Sunday but he reports that the symptoms progressing. The patient reports that he was hit hard on Monday and laid in bed all day. Tuesday he felt okay and Wednesday he went out with his dad. He reports though that he was having some sweats and faint bundled up. He denies any sick contacts and reports that his body aches are in a out of 10 in intensity. He's had some wheezing with no vomiting no neck pain and no headache. He's had some slight diarrhea. The patient has a primary care physician but did not going get checked out. The patient's mother was concerned so she decided to bring him into the hospital to get checked out.   Past Medical History  Diagnosis Date  . Asthma   . Pneumonia     Patient Active Problem List   Diagnosis Date Noted  . Low back pain 07/26/2013  . OTITIS EXTERNA, ACUTE, RIGHT 02/13/2011  . OTITIS MEDIA, ACUTE WITH RUPTURE OF TYMPANIC MEMBRANE 02/13/2011  . HEADACHE 02/13/2011  . LOM 03/29/2009  . ASTHMA 01/20/2008  . 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 ADHD 01/19/2008  . ECZEMA 01/19/2008    Past Surgical History  Procedure Laterality Date  . Cholecystectomy     . Spleenectomy      Current Outpatient Rx  Name  Route  Sig  Dispense  Refill  . ibuprofen (ADVIL,MOTRIN) 800 MG tablet   Oral   Take 800 mg by mouth every 8 (eight) hours as needed for pain.         Marland Kitchen amoxicillin-clavulanate (AUGMENTIN) 875-125 MG tablet   Oral   Take 1 tablet by mouth 2 (two) times daily.   14 tablet   0   . chlorpheniramine-HYDROcodone (TUSSIONEX PENNKINETIC ER) 10-8 MG/5ML SUER   Oral   Take 5 mLs by mouth 2 (two) times daily.   100 mL   0   . cyclobenzaprine (FLEXERIL) 10 MG tablet   Oral   Take 1 tablet (10 mg total) by mouth 3 (three) times daily as needed for muscle spasms. Patient not taking: Reported on 02/16/2016   30 tablet   0   . traMADol (ULTRAM) 50 MG tablet   Oral   Take 1 tablet (50 mg total) by mouth every 8 (eight) hours as needed for pain. Patient not taking: Reported on 02/16/2016   30 tablet   0     Allergies Review of patient's allergies indicates no known allergies.  No family history on file.  Social History Social History  Substance Use Topics  . Smoking status: Former Games developer  . Smokeless tobacco: None  . Alcohol Use: Yes     Comment: occ  Review of Systems Constitutional:  fever/chills Eyes: No visual changes. WUJ:WJXB throat. Cardiovascular: Denies chest pain. Respiratory: Cough Gastrointestinal: Diarrhea with No abdominal pain.  No nausea, no vomiting.  No constipation. Genitourinary: Negative for dysuria. Musculoskeletal: Body aches Skin: Negative for rash. Neurological: Negative for headaches, focal weakness or numbness.  10-point ROS otherwise negative.  ____________________________________________   PHYSICAL EXAM:  VITAL SIGNS: ED Triage Vitals  Enc Vitals Group     BP 02/16/16 1939 121/54 mmHg     Pulse Rate 02/16/16 1939 108     Resp 02/16/16 1939 20     Temp 02/16/16 1939 100.6 F (38.1 C)     Temp Source 02/16/16 1939 Oral     SpO2 02/16/16 1939 95 %     Weight 02/16/16 1939 255  lb (115.667 kg)     Height 02/16/16 1939  (1.93 m)     Head Cir --      Peak Flow --      Pain Score 02/16/16 1940 9     Pain Loc --      Pain Edu? --      Excl. in GC? --     Constitutional: Alert and oriented. Well appearing and in mild to moderate distress. Eyes: Conjunctivae are normal. PERRL. EOMI. Head: Atraumatic. Nose: No congestion/rhinnorhea. Mouth/Throat: Mucous membranes are moist.  Oropharynx non-erythematous. Cardiovascular: Tachycardia, regular rhythm. Grossly normal heart sounds.  Good peripheral circulation. Respiratory: Normal respiratory effort.  No retractions. Lungs CTAB. Gastrointestinal: Soft and nontender. No distention. Positive bowel sounds Musculoskeletal: No lower extremity tenderness nor edema.   Neurologic:  Normal speech and language.  Skin:  Skin is warm, dry and intact. No rash noted. Psychiatric: Mood and affect are normal.   ____________________________________________   LABS (all labs ordered are listed, but only abnormal results are displayed)  Labs Reviewed  CBC WITH DIFFERENTIAL/PLATELET - Abnormal; Notable for the following:    WBC 17.7 (*)    Hemoglobin 18.7 (*)    HCT 52.9 (*)    Neutro Abs 13.9 (*)    Lymphs Abs 0.9 (*)    Monocytes Absolute 2.7 (*)    All other components within normal limits  COMPREHENSIVE METABOLIC PANEL - Abnormal; Notable for the following:    Sodium 134 (*)    Chloride 100 (*)    Glucose, Bld 113 (*)    Calcium 8.7 (*)    AST 54 (*)    Alkaline Phosphatase 35 (*)    All other components within normal limits  RAPID INFLUENZA A&B ANTIGENS ([facility] ONLY)  CULTURE, BLOOD (ROUTINE X 2)  CULTURE, BLOOD (ROUTINE X 2)  LACTIC ACID, PLASMA   ____________________________________________  EKG  None ____________________________________________  RADIOLOGY  Chest x-ray: No acute cardiopulmonary process seen ____________________________________________   PROCEDURES  Procedure(s) performed:  None  Critical Care performed: No  ____________________________________________   INITIAL IMPRESSION / ASSESSMENT AND PLAN / ED COURSE  Pertinent labs & imaging results that were available during my care of the patient were reviewed by me and considered in my medical decision making (see chart for details).  This is a 25 year old male who comes into the hospital today with a week of fever and body aches. The patient had a rapid flu done as well as a lactic acid. The patient does have a white blood cell count of 17 but he is also hemoconcentrated with a hemoglobin of 18.7 and a hematocrit of 52.9. I feel that the white blood cell count is spuriously elevated  due to the patient's dehydration. I did give the patient 2 L of normal saline and a dose of Toradol. I also checked a strep test that was negative. 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 After the fluids and the medication the patient reports he feels much improved. The patient's chest x-ray does not show any pneumonia but I will still treat the patient with antibiotics for bronchitis. I also will encourage him to follow up with his primary care physician. I discussed with the patient and mom return precautions should any of his symptoms worsen. I did inform them that given his splenectomy he is at risk for more aggressive bacteria and he should return sooner if his symptoms are not improving. Otherwise the patient has no further complaints or concerns and he will be discharged home. ____________________________________________   FINAL CLINICAL IMPRESSION(S) / ED DIAGNOSES  Final diagnoses:  Fever, unspecified fever cause  Myalgia  Bronchitis      Rebecka ApleyAllison P Webster, MD 02/17/16 [phone]

## 2016-02-17 NOTE — Discharge Instructions (Signed)
Fever, Adult A fever is an increase in the body's temperature. It is usually defined as a temperature of 100F (38C) or higher. Brief mild or moderate fevers generally have no long-term effects, and they often do not require treatment. Moderate or high fevers may make you feel uncomfortable and can sometimes be a sign of a serious illness or disease. The sweating that may occur with repeated or prolonged fever may also cause dehydration. Fever is confirmed by taking a temperature with a thermometer. A measured temperature can vary with:  Age.  Time of day.  Location of the thermometer:  Mouth (oral).  Rectum (rectal).  Ear (tympanic).  Underarm (axillary).  Forehead (temporal). HOME CARE INSTRUCTIONS Pay attention to any changes in your symptoms. Take these actions to help with your condition:  Take over-the counter and prescription medicines only as told by your health care provider. Follow the dosing instructions carefully.  If you were prescribed an antibiotic medicine, take it as told by your health care provider. Do not stop taking the antibiotic even if you start to feel better.  Rest as needed.  Drink enough fluid to keep your urine clear or pale yellow. This helps to prevent dehydration.  Sponge yourself or bathe with room-temperature water to help reduce your body temperature as needed. Do not use ice water.  Do not overbundle yourself in blankets or heavy clothes. SEEK MEDICAL CARE IF:  You vomit.  You cannot eat or drink without vomiting.  You have diarrhea.  You have pain when you urinate.  Your symptoms do not improve with treatment.  You develop new symptoms.  You develop excessive weakness. SEEK IMMEDIATE MEDICAL CARE IF:  You have shortness of breath or have trouble breathing.  You are dizzy or you faint.  You are disoriented or confused.  You develop signs of dehydration, such as a dry mouth, decreased urination, or paleness.  You develop  severe pain in your abdomen.  You have persistent vomiting or diarrhea.  You develop a skin rash.  Your symptoms suddenly get worse.   This information is not intended to replace advice given to you by your health care provider. Make sure you discuss any questions you have with your health care provider.   Document Released: 05/13/2001 Document Revised: 08/08/2015 Document Reviewed: 01/11/2015 Elsevier Interactive Patient Education 2016 Elsevier Inc[employer]  Muscle Pain, Adult Muscle pain (myalgia) may be caused by many things, including:  Overuse or muscle strain, especially if you are not in shape. This is the most common cause of muscle pain.  Injury.  Bruises.  Viruses, such as the flu.  Infectious diseases.  Fibromyalgia, which is a chronic condition that causes muscle tenderness, fatigue, and headache.  Autoimmune diseases, including lupus.  Certain drugs, including ACE inhibitors and statins. Muscle pain may be mild or severe. In most cases, the pain lasts only a short time and goes away without treatment. To diagnose the cause of your muscle pain, your health care provider will take your medical history. This means he or she will ask you when your muscle pain began and what has been happening. If you have not had muscle pain for very long, your health care provider may want to wait before doing much testing. If your muscle pain has lasted a long time, your health care provider may want to run tests right away. If your health care provider thinks your muscle pain may be caused by illness, you may need to have additional tests to rule out  certain conditions.  Treatment for muscle pain depends on the cause. Home care is often enough to relieve muscle pain. Your health care provider may also prescribe anti-inflammatory medicine. HOME CARE INSTRUCTIONS Watch your condition for any changes. The following actions may help to lessen any discomfort you are feeling:  Only take  over-the-counter or prescription medicines as directed by your health care provider.  Apply ice to the sore muscle:  Put ice in a plastic bag.  Place a towel between your skin and the bag.  Leave the ice on for 15-20 minutes, 3-4 times a day.  You may alternate applying hot and cold packs to the muscle as directed by your health care provider.  If overuse is causing your muscle pain, slow down your activities until the pain goes away.  Remember that it is normal to feel some muscle pain after starting a workout program. Muscles that have not been used often will be sore at first.  Do regular, gentle exercises if you are not usually active.  Warm up before exercising to lower your risk of muscle pain.  Do not continue working out if the pain is very bad. Bad pain could mean you have injured a muscle. SEEK MEDICAL CARE IF:  Your muscle pain gets worse, and medicines do not help.  You have muscle pain that lasts longer than 3 days.  You have a rash or fever along with muscle pain.  You have muscle pain after a tick bite.  You have muscle pain while working out, even though you are in good physical condition.  You have redness, soreness, or swelling along with muscle pain.  You have muscle pain after starting a new medicine or changing the dose of a medicine. SEEK IMMEDIATE MEDICAL CARE IF:  You have trouble breathing.  You have trouble swallowing.  You have muscle pain along with a stiff neck, fever, and vomiting.  You have severe muscle weakness or cannot move part of your body. MAKE SURE YOU:   Understand these instructions.  Will watch your condition.  Will get help right away if you are not doing well or get worse.   This information is not intended to replace advice given to you by your health care provider. Make sure you discuss any questions you have with your health care provider.   Document Released: 10/09/2006 Document Revised: 12/08/2014 Document  Reviewed: 09/13/2013 Elsevier Interactive Patient Education 2016 Elsevier Inc.  Upper Respiratory Infection, Adult Most upper respiratory infections (URIs) are a viral infection of the air passages leading to the lungs. A URI affects the nose, throat, and upper air passages. The most common type of URI is nasopharyngitis and is typically referred to as "the common cold." URIs run their course and usually go away on their own. Most of the time, a URI does not require medical attention, but sometimes a bacterial infection in the upper airways can follow a viral infection. This is called a secondary infection. Sinus and middle ear infections are common types of secondary upper respiratory infections. Bacterial pneumonia can also complicate a URI. A URI can worsen asthma and chronic obstructive pulmonary disease (COPD). Sometimes, these complications can require emergency medical care and may be life threatening.  CAUSES Almost all URIs are caused by viruses. A virus is a type of germ and can spread from one person to another.  RISKS FACTORS You may be at risk for a URI if:   You smoke.   You have chronic heart or  lung disease.  You have a weakened defense (immune) system.   You are very young or very old.   You have nasal allergies or asthma.  You work in crowded or poorly ventilated areas.  You work in health care facilities or schools. SIGNS AND SYMPTOMS  Symptoms typically develop 2-3 days after you come in contact with a cold virus. Most viral URIs last 7-10 days. However, viral URIs from the influenza virus (flu virus) can last 14-18 days and are typically more severe. Symptoms may include:   Runny or stuffy (congested) nose.   Sneezing.   Cough.   Sore throat.   Headache.   Fatigue.   Fever.   Loss of appetite.   Pain in your forehead, behind your eyes, and over your cheekbones (sinus pain).  Muscle aches.  DIAGNOSIS  Your health care provider may  diagnose a URI by:  Physical exam.  Tests to check that your symptoms are not due to another condition such as:  Strep throat.  Sinusitis.  Pneumonia.  Asthma. TREATMENT  A URI goes away on its own with time. It cannot be cured with medicines, but medicines may be prescribed or recommended to relieve symptoms. Medicines may help:  Reduce your fever.  Reduce your cough.  Relieve nasal congestion. HOME CARE INSTRUCTIONS   Take medicines only as directed by your health care provider.   Gargle warm saltwater or take cough drops to comfort your throat as directed by your health care provider.  Use a warm mist humidifier or inhale steam from a shower to increase air moisture. This may make it easier to breathe.  Drink enough fluid to keep your urine clear or pale yellow.   Eat soups and other clear broths and maintain good nutrition.   Rest as needed.   Return to work when your temperature has returned to normal or as your health care provider advises. You may need to stay home longer to avoid infecting others. You can also use a face mask and careful hand washing to prevent spread of the virus.  Increase the usage of your inhaler if you have asthma.   Do not use any tobacco products, including cigarettes, chewing tobacco, or electronic cigarettes. If you need help quitting, ask your health care provider. PREVENTION  The best way to protect yourself from getting a cold is to practice good hygiene.   Avoid oral or hand contact with people with cold symptoms.   Wash your hands often if contact occurs.  There is no clear evidence that vitamin C, vitamin E, echinacea, or exercise reduces the chance of developing a cold. However, it is always recommended to get plenty of rest, exercise, and practice good nutrition.  SEEK MEDICAL CARE IF:   You are getting worse rather than better.   Your symptoms are not controlled by medicine.   You have chills.  You have  worsening shortness of breath.  You have brown or red mucus.  You have yellow or brown nasal discharge.  You have pain in your face, especially when you bend forward.  You have a fever.  You have swollen neck glands.  You have pain while swallowing.  You have white areas in the back of your throat. SEEK IMMEDIATE MEDICAL CARE IF:   You have severe or persistent:  Headache.  Ear pain.  Sinus pain.  Chest pain.  You have chronic lung disease and any of the following:  Wheezing.  Prolonged cough.  Coughing up blood.  A change in your usual mucus. °· You have a stiff neck. °· You have changes in your: °¨ Vision. °¨ Hearing. °¨ Thinking. °¨ Mood. °MAKE SURE YOU:  °· Understand these instructions. °· Will watch your condition. °· Will get help right away if you are not doing well or get worse. °  °This information is not intended to replace advice given to you by your health care provider. Make sure you discuss any questions you have with your health care provider. °  °Document Released: 05/13/2001 Document Revised: 04/03/2015 Document Reviewed: 02/22/2014 °Elsevier Interactive Patient Education ©2016 Elsevier Inc. ° °

## 2016-02-21 LAB — CULTURE, BLOOD (ROUTINE X 2)
CULTURE: NO GROWTH
CULTURE: NO GROWTH

## 2016-12-28 IMAGING — CR DG CHEST 2V
1 series · 2 of 2 positions shown · non-contrast
Comparison: None.

CLINICAL DATA: Acute onset of cough and shortness of breath.
Generalized chest pain. Initial encounter.

EXAM:
CHEST  2 VIEW

[Series 1: dg chest 2 view · 0.14mm/px · 2 of 2 slices shown]
[im 1/2]
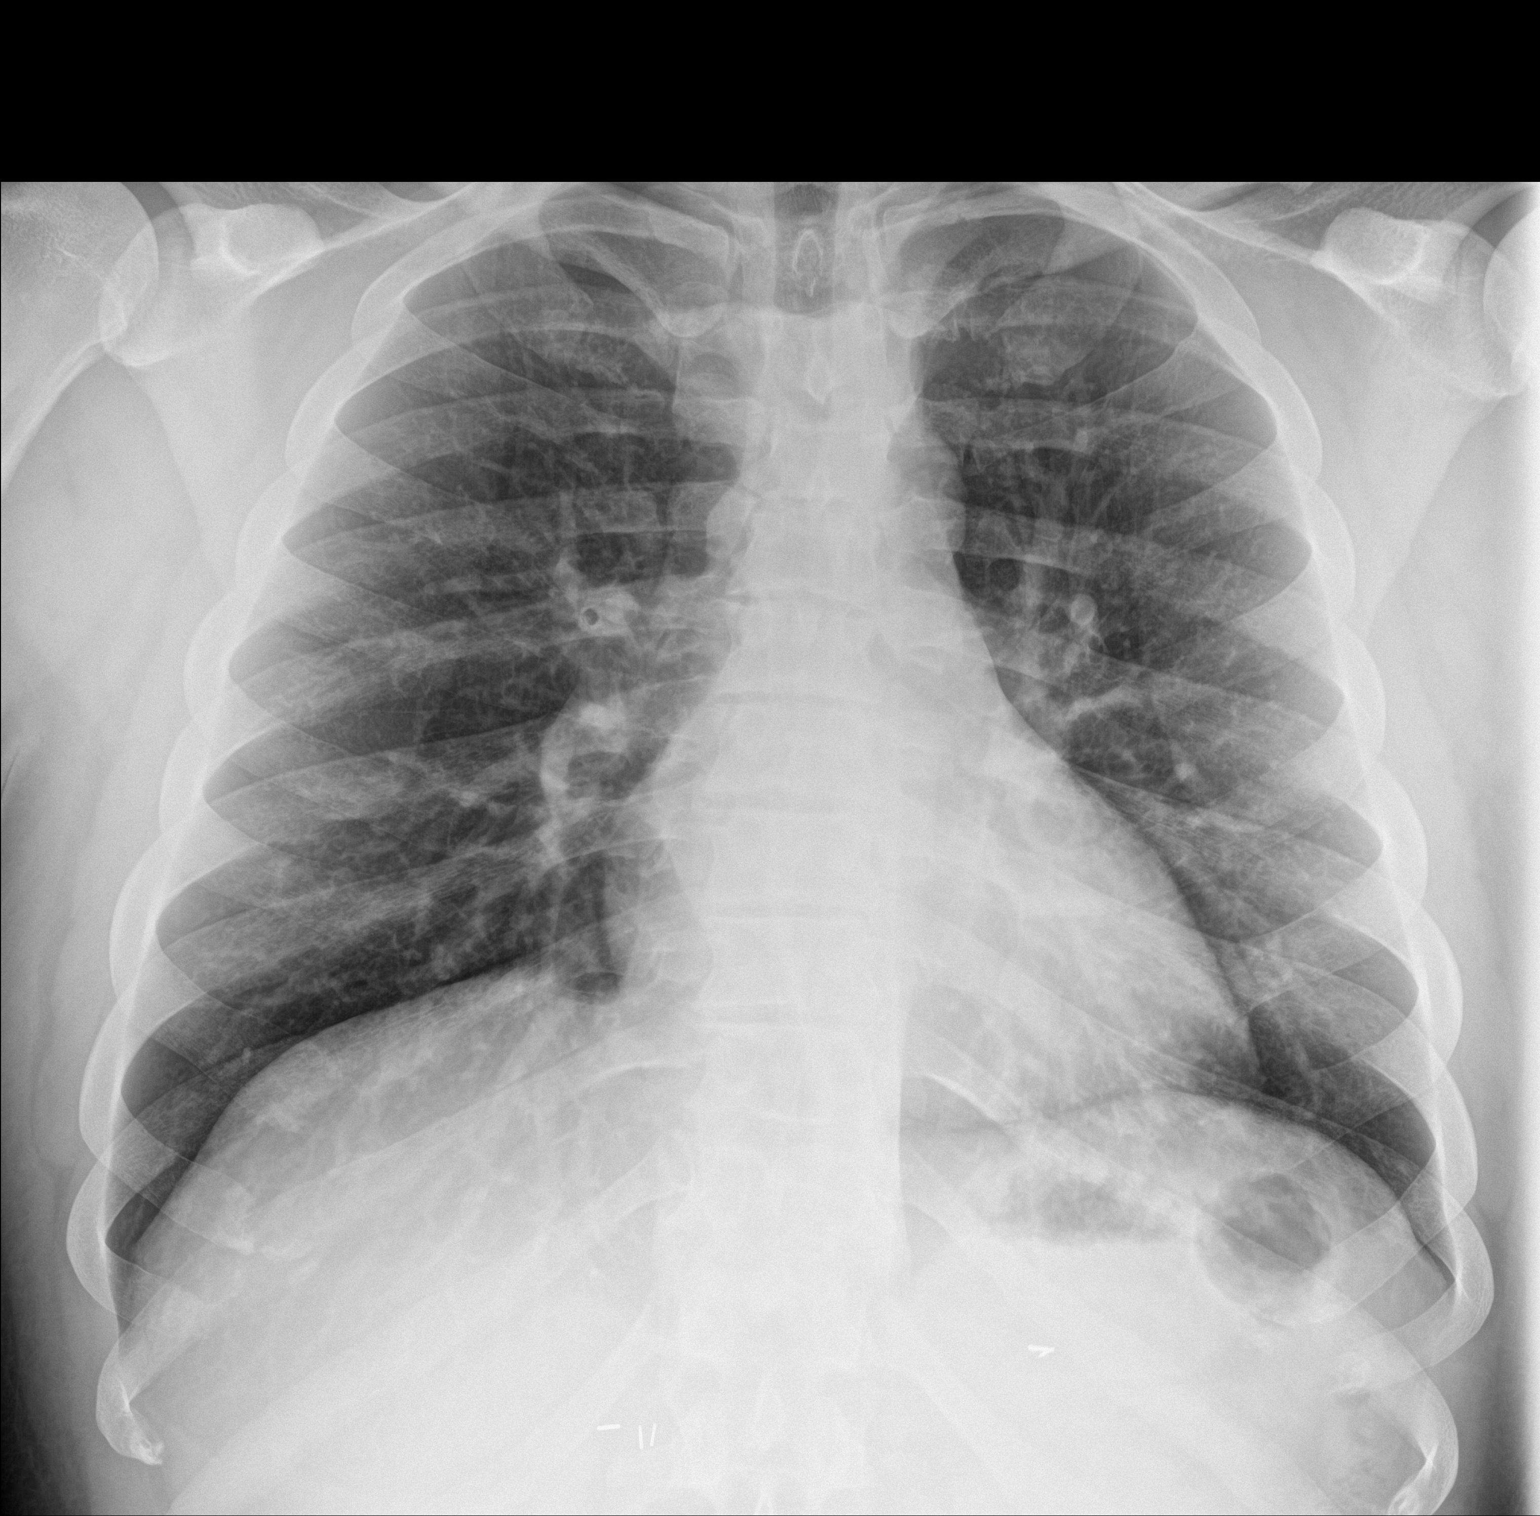
[im 2/2]
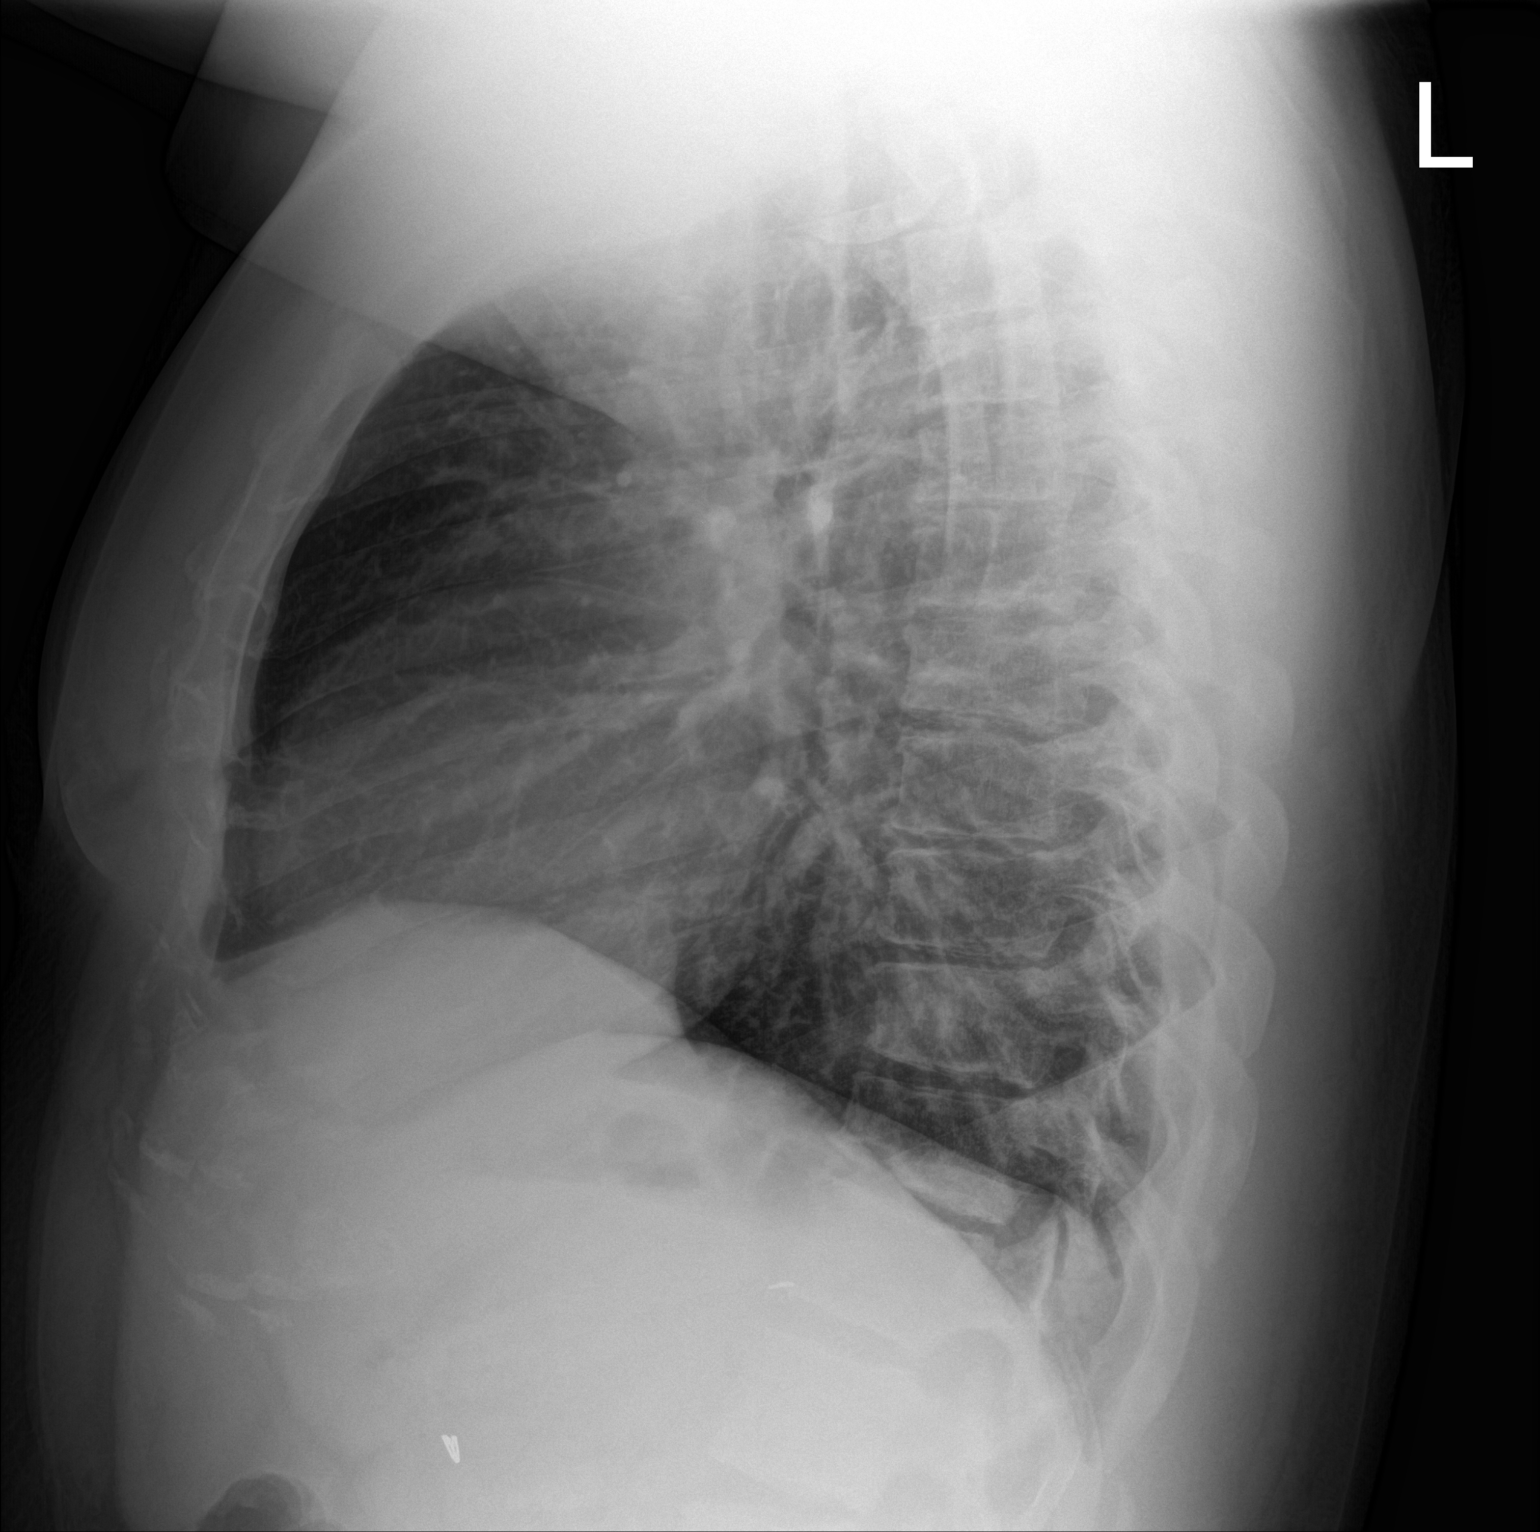

[2 of 2 positions shown; findings below may reference images not displayed]

FINDINGS: The lungs are well-aerated. Pulmonary vascularity is at the upper
limits of normal. There is no evidence of focal opacification,
pleural effusion or pneumothorax.

The heart is normal in size; the mediastinal contour is within
normal limits. No acute osseous abnormalities are seen.
IMPRESSION: No acute cardiopulmonary process seen.

## 2016-12-31 ENCOUNTER — Emergency Department
Admission: EM | Admit: 2016-12-31 | Discharge: 2016-12-31 | Disposition: A | Discharge status: Home / Self Care | Payer: Self-pay | Attending: Emergency Medicine | Admitting: Emergency Medicine

## 2016-12-31 ENCOUNTER — Encounter: Payer: Self-pay | Admitting: Emergency Medicine

## 2016-12-31 DIAGNOSIS — J09X2 Influenza due to identified novel influenza A virus with other respiratory manifestations: Secondary | ICD-10-CM | POA: Insufficient documentation

## 2016-12-31 DIAGNOSIS — F909 Attention-deficit hyperactivity disorder, unspecified type: Secondary | ICD-10-CM | POA: Insufficient documentation

## 2016-12-31 DIAGNOSIS — J101 Influenza due to other identified influenza virus with other respiratory manifestations: Secondary | ICD-10-CM

## 2016-12-31 DIAGNOSIS — Z87891 Personal history of nicotine dependence: Secondary | ICD-10-CM | POA: Insufficient documentation

## 2016-12-31 DIAGNOSIS — J45909 Unspecified asthma, uncomplicated: Secondary | ICD-10-CM | POA: Insufficient documentation

## 2016-12-31 DIAGNOSIS — Z791 Long term (current) use of non-steroidal anti-inflammatories (NSAID): Secondary | ICD-10-CM | POA: Insufficient documentation

## 2016-12-31 MED ORDER — ONDANSETRON 4 MG PO TBDP: 4.0000 mg | ORAL_TABLET | Freq: Once | ORAL | Status: DC | PRN

## 2016-12-31 MED ORDER — BENZONATATE 100 MG PO CAPS: 100.0000 mg | ORAL_CAPSULE | Freq: Three times a day (TID) | ORAL | 0 refills | Status: AC | PRN

## 2016-12-31 MED ORDER — ACETAMINOPHEN 500 MG PO TABS
ORAL_TABLET | ORAL | Status: AC
  Filled 2016-12-31: qty 2

## 2016-12-31 MED ORDER — ACETAMINOPHEN 500 MG PO TABS
1000.0000 mg | ORAL_TABLET | Freq: Once | ORAL | Status: AC
  Administered 2016-12-31: 1000 mg via ORAL

## 2016-12-31 MED ORDER — OSELTAMIVIR PHOSPHATE 75 MG PO CAPS: 75.0000 mg | ORAL_CAPSULE | Freq: Two times a day (BID) | ORAL | 0 refills | Status: AC

## 2016-12-31 NOTE — ED Notes (Addendum)
Pt. States fever, body aches, chills, headache, N/V starting today. Pt. Denies OTC rx PTA. Pt. Reports vomiting helps to feel a little better.

## 2016-12-31 NOTE — ED Triage Notes (Signed)
Pt to ED from home c/o chills, whole body aches, n/v x1 starting this morning.  Patient states came to ER earlier to be seen but vomited in lobby and felt better so he left.

## 2016-12-31 NOTE — ED Notes (Signed)

## 2016-12-31 NOTE — ED Provider Notes (Signed)
[facility] Emergency Department Provider Note  ____________________________________________  Time seen: Approximately 9:18 PM  I have reviewed the triage vital signs and the nursing notes.   HISTORY  Chief Complaint Fever; Generalized Body Aches; and Emesis   HPI Frank Bell is a 26 y.o. male presenting to the emergency department with headache, congestion, rhinorrhea, nausea, vomitting and malaise that started today. Patient states that symptoms started acutely. He has been tolerating fluids by mouth. Patient has experienced diminished appetite. No recent travel. Patient has numerous sick contacts at work. He denies chest pain, chest tightness, shortness of breath, dysuria and hematuria. He has taken Tylenol but has attempted no other alleviating measures. Patient states that he has had fever. He has not evaluated his temperature.    Past Medical History:  Diagnosis Date  . Asthma   . Pneumonia     Patient Active Problem List   Diagnosis Date Noted  . Low back pain 07/26/2013  . OTITIS EXTERNA, ACUTE, RIGHT 02/13/2011  . OTITIS MEDIA, ACUTE WITH RUPTURE OF TYMPANIC MEMBRANE 02/13/2011  . HEADACHE 02/13/2011  . LOM 03/29/2009  . ASTHMA 01/20/2008  . 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 ADHD 01/19/2008  . ECZEMA 01/19/2008    Past Surgical History:  Procedure Laterality Date  . CHOLECYSTECTOMY    . SPLEENECTOMY      Prior to Admission medications   Medication Sig Start Date End Date Taking? Authorizing Provider  benzonatate (TESSALON PERLES) 100 MG capsule Take 1 capsule (100 mg total) by mouth 3 (three) times daily as needed for cough. 12/31/16 01/07/17  Orvil FeilJaclyn M Janavia Rottman, PA-C  chlorpheniramine-HYDROcodone (TUSSIONEX PENNKINETIC ER) 10-8 MG/5ML SUER Take 5 mLs by mouth 2 (two) times daily. 02/17/16   Rebecka ApleyAllison P Webster, MD  cyclobenzaprine (FLEXERIL) 10 MG tablet Take 1 tablet (10 mg total) by mouth 3 (three) times daily as needed for muscle  spasms. Patient not taking: Reported on 02/16/2016 07/26/13   Ivonne AndrewPeter Dammen, PA-C  ibuprofen (ADVIL,MOTRIN) 800 MG tablet Take 800 mg by mouth every 8 (eight) hours as needed for pain.    Historical Provider, MD  oseltamivir (TAMIFLU) 75 MG capsule Take 1 capsule (75 mg total) by mouth 2 (two) times daily. 12/31/16 01/05/17  Orvil FeilJaclyn M Jacklynn Dehaas, PA-C  traMADol (ULTRAM) 50 MG tablet Take 1 tablet (50 mg total) by mouth every 8 (eight) hours as needed for pain. Patient not taking: Reported on 02/16/2016 07/26/13   Judy PimpleMarne A Tower, MD    Allergies Patient has no known allergies.  History reviewed. No pertinent family history.  Social History Social History  Substance Use Topics  . Smoking status: Former Games developermoker  . Smokeless tobacco: Never Used  . Alcohol use Yes     Comment: occ    Review of Systems  Constitutional: Patient has had fever.  Eyes: No visual changes. No discharge ENT: Patient has had congestion.  Cardiovascular: no chest pain. Respiratory: Patient has had non-productive cough.  No SOB. Gastrointestinal: Patient has had nausea.  Genitourinary: Negative for dysuria. No hematuria Musculoskeletal: Patient has had myalgias. Skin: Negative for rash, abrasions, lacerations, ecchymosis. Neurological: Patient has had headache, no focal weakness or numbness. ____________________________________________   PHYSICAL EXAM:  VITAL SIGNS: ED Triage Vitals  Enc Vitals Group     BP 12/31/16 1933 133/66     Pulse Rate 12/31/16 1933 (!) 104     Resp 12/31/16 1933 16     Temp 12/31/16 1933 99.9 F (37.7 C)     Temp Source  12/31/16 1933 Oral     SpO2 12/31/16 1933 97 %     Weight 12/31/16 1934 265 lb (120.2 kg)     Height 12/31/16 1934 6\' 3"  (1.905 m)     Head Circumference --      Peak Flow --      Pain Score 12/31/16 1934 8     Pain Loc --      Pain Edu? --      Excl. in GC? --     Constitutional: Alert and oriented. Patient is lying supine in bed.  Eyes: Conjunctivae are normal.  PERRL. EOMI. Head: Atraumatic. ENT:      Ears: Tympanic membranes are injected bilaterally without evidence of effusion or purulent exudate. Bony landmarks are visualized bilaterally. No pain with palpation at the tragus.      Nose: Nasal turbinates are edematous and erythematous. Copious rhinorrhea visualized.      Mouth/Throat: Mucous membranes are moist. Posterior pharynx is mildly erythematous. No tonsillar hypertrophy or purulent exudate. Uvula is midline. Neck: Full range of motion. No pain is elicited with flexion at the neck. Hematological/Lymphatic/Immunilogical: No cervical lymphadenopathy. Cardiovascular: Mildly tachycardic, regular rhythm. Normal S1 and S2.  Good peripheral circulation. Respiratory: Normal respiratory effort without tachypnea or retractions. Lungs CTAB. Good air entry to the bases with no decreased or absent breath sounds. Gastrointestinal: Bowel sounds 4 quadrants. Soft and nontender to palpation. No guarding or rigidity. No palpable masses. No distention. No CVA tenderness.  Skin:  Skin is warm, dry and intact. No rash noted. Psychiatric: Mood and affect are normal. Speech and behavior are normal. Patient exhibits appropriate insight and judgement.  ____________________________________________   LABS (all labs ordered are listed, but only abnormal results are displayed)  Labs Reviewed - No data to display ____________________________________________  EKG   ____________________________________________  RADIOLOGY  No results found.  ____________________________________________    PROCEDURES  Procedure(s) performed:    Procedures    Medications  acetaminophen (TYLENOL) 500 MG tablet (not administered)  acetaminophen (TYLENOL) tablet 1,000 mg (1,000 mg Oral Given 12/31/16 2042)     ____________________________________________   INITIAL IMPRESSION / ASSESSMENT AND PLAN / ED COURSE  Pertinent labs & imaging results that were available  during my care of the patient were reviewed by me and considered in my medical decision making (see chart for details).  Review of the [place] CSRS was performed in accordance of the [facility] prior to dispensing any controlled drugs.    Assessment and Plan:  Influenza:  Patient presents to the emergency department with headache, congestion, rhinorrhea, nausea, vomitting and malaise that started today. Symptoms are consistent with influenza. Patient was discharged with Tamiflu and Tessalon Perles. Rest and hydration were encouraged. Patient was advised to follow-up with his primary care provider in one week. Physical exam and vital signs are reassuring at this time. All patient questions were answered. ___________________________________________  FINAL CLINICAL IMPRESSION(S) / ED DIAGNOSES  Final diagnoses:  Influenza A      NEW MEDICATIONS STARTED DURING THIS VISIT:  New Prescriptions   BENZONATATE (TESSALON PERLES) 100 MG CAPSULE    Take 1 capsule (100 mg total) by mouth 3 (three) times daily as needed for cough.   OSELTAMIVIR (TAMIFLU) 75 MG CAPSULE    Take 1 capsule (75 mg total) by mouth 2 (two) times daily.        This chart was dictated using voice recognition software/Dragon. Despite best efforts to proofread, errors can occur which can change the meaning. Any change  was purely unintentional.    Orvil Feil, PA-C 12/31/16 2140    Phineas Semen, MD 12/31/16 2230

## 2016-12-31 NOTE — ED Notes (Signed)
No answer when called from lobby 

## 2018-07-14 ENCOUNTER — Ambulatory Visit (HOSPITAL_COMMUNITY)
Admission: AD | Admit: 2018-07-14 | Discharge: 2018-07-14 | Disposition: A | Discharge status: Home / Self Care | Payer: Self-pay | Source: Other Acute Inpatient Hospital | Attending: Emergency Medicine | Admitting: Emergency Medicine

## 2018-07-14 ENCOUNTER — Emergency Department
Admission: EM | Admit: 2018-07-14 | Discharge: 2018-07-14 | Disposition: A | Discharge status: Other Acute Inpatient Hospital | Payer: Self-pay | Attending: Emergency Medicine | Admitting: Emergency Medicine

## 2018-07-14 ENCOUNTER — Encounter: Payer: Self-pay | Admitting: Emergency Medicine

## 2018-07-14 ENCOUNTER — Other Ambulatory Visit: Payer: Self-pay

## 2018-07-14 DIAGNOSIS — Y9389 Activity, other specified: Secondary | ICD-10-CM | POA: Insufficient documentation

## 2018-07-14 DIAGNOSIS — T2000XA Burn of unspecified degree of head, face, and neck, unspecified site, initial encounter: Secondary | ICD-10-CM

## 2018-07-14 DIAGNOSIS — X088XXA Exposure to other specified smoke, fire and flames, initial encounter: Secondary | ICD-10-CM | POA: Insufficient documentation

## 2018-07-14 DIAGNOSIS — Y999 Unspecified external cause status: Secondary | ICD-10-CM | POA: Insufficient documentation

## 2018-07-14 DIAGNOSIS — T300 Burn of unspecified body region, unspecified degree: Secondary | ICD-10-CM | POA: Insufficient documentation

## 2018-07-14 DIAGNOSIS — T799XXA Unspecified early complication of trauma, initial encounter: Secondary | ICD-10-CM | POA: Insufficient documentation

## 2018-07-14 DIAGNOSIS — Y9289 Other specified places as the place of occurrence of the external cause: Secondary | ICD-10-CM | POA: Insufficient documentation

## 2018-07-14 DIAGNOSIS — T2640XA Burn of unspecified eye and adnexa, part unspecified, initial encounter: Secondary | ICD-10-CM

## 2018-07-14 DIAGNOSIS — T2652XA Corrosion of left eyelid and periocular area, initial encounter: Secondary | ICD-10-CM | POA: Insufficient documentation

## 2018-07-14 DIAGNOSIS — Z23 Encounter for immunization: Secondary | ICD-10-CM | POA: Insufficient documentation

## 2018-07-14 DIAGNOSIS — T2651XA Corrosion of right eyelid and periocular area, initial encounter: Secondary | ICD-10-CM | POA: Insufficient documentation

## 2018-07-14 DIAGNOSIS — T543X1A Toxic effect of corrosive alkalis and alkali-like substances, accidental (unintentional), initial encounter: Secondary | ICD-10-CM | POA: Insufficient documentation

## 2018-07-14 DIAGNOSIS — X19XXXA Contact with other heat and hot substances, initial encounter: Secondary | ICD-10-CM | POA: Insufficient documentation

## 2018-07-14 LAB — CBC WITH DIFFERENTIAL/PLATELET
BASOS ABS: 0.2 10*3/uL — AB (ref 0–0.1)
Basophils Relative: 1 %
Eosinophils Absolute: 0.7 10*3/uL (ref 0–0.7)
Eosinophils Relative: 4 %
HEMATOCRIT: 47.7 % (ref 40.0–52.0)
Hemoglobin: 17.2 g/dL (ref 13.0–18.0)
LYMPHS PCT: 10 %
Lymphs Abs: 1.8 10*3/uL (ref 1.0–3.6)
MCH: 33.4 pg (ref 26.0–34.0)
MCHC: 36 g/dL (ref 32.0–36.0)
MCV: 92.7 fL (ref 80.0–100.0)
Monocytes Absolute: 1.7 10*3/uL — ABNORMAL HIGH (ref 0.2–1.0)
Monocytes Relative: 9 %
NEUTROS ABS: 14 10*3/uL — AB (ref 1.4–6.5)
Neutrophils Relative %: 76 %
Platelets: 365 10*3/uL (ref 150–440)
RBC: 5.14 MIL/uL (ref 4.40–5.90)
RDW: 13.5 % (ref 11.5–14.5)
WBC: 18.4 10*3/uL — ABNORMAL HIGH (ref 3.8–10.6)

## 2018-07-14 LAB — BASIC METABOLIC PANEL
Anion gap: 5 (ref 5–15)
BUN: 14 mg/dL (ref 6–20)
CO2: 28 mmol/L (ref 22–32)
Calcium: 8.9 mg/dL (ref 8.9–10.3)
Chloride: 106 mmol/L (ref 98–111)
Creatinine, Ser: 0.86 mg/dL (ref 0.61–1.24)
GFR calc Af Amer: >60 mL/min (ref >60)
GLUCOSE: 99 mg/dL (ref 70–99)
Potassium: 4.3 mmol/L (ref 3.5–5.1)
Sodium: 139 mmol/L (ref 135–145)

## 2018-07-14 MED ORDER — TETANUS-DIPHTH-ACELL PERTUSSIS 5-2.5-18.5 LF-MCG/0.5 IM SUSP
0.5000 mL | Freq: Once | INTRAMUSCULAR | Status: AC
  Administered 2018-07-14: 0.5 mL via INTRAMUSCULAR
  Filled 2018-07-14: qty 0.5

## 2018-07-14 MED ORDER — PROPARACAINE HCL 0.5 % OP SOLN
2.0000 [drp] | Freq: Once | OPHTHALMIC | Status: DC
  Filled 2018-07-14: qty 15

## 2018-07-14 MED ORDER — TETRACAINE HCL 0.5 % OP SOLN
OPHTHALMIC | Status: AC
  Administered 2018-07-14: 2 [drp] via OPHTHALMIC
  Filled 2018-07-14: qty 4

## 2018-07-14 MED ORDER — HYDROMORPHONE HCL 1 MG/ML IJ SOLN
1.0000 mg | Freq: Once | INTRAMUSCULAR | Status: AC
  Administered 2018-07-14: 1 mg via INTRAVENOUS

## 2018-07-14 MED ORDER — LORAZEPAM 2 MG/ML IJ SOLN
1.0000 mg | Freq: Once | INTRAMUSCULAR | Status: AC
  Administered 2018-07-14: 1 mg via INTRAVENOUS
  Filled 2018-07-14: qty 1

## 2018-07-14 MED ORDER — HYDROMORPHONE HCL 1 MG/ML IJ SOLN
1.0000 mg | INTRAMUSCULAR | Status: AC
  Administered 2018-07-14: 1 mg via INTRAVENOUS
  Filled 2018-07-14: qty 1

## 2018-07-14 MED ORDER — HYDROMORPHONE HCL 1 MG/ML IJ SOLN
INTRAMUSCULAR | Status: AC
  Administered 2018-07-14: 1 mg via INTRAVENOUS
  Filled 2018-07-14: qty 1

## 2018-07-14 MED ORDER — TETRACAINE HCL 0.5 % OP SOLN
2.0000 [drp] | Freq: Once | OPHTHALMIC | Status: AC
  Administered 2018-07-14: 2 [drp] via OPHTHALMIC

## 2018-07-14 NOTE — ED Notes (Signed)
Pt continues with eye irrigation. Pt is able to better tolerate after the medication. Pt listening to music at this time.

## 2018-07-14 NOTE — ED Notes (Signed)
[facility] (kim) called for transport

## 2018-07-14 NOTE — ED Notes (Signed)
[name] switched to right eye. Pt remaining calm and able to tolerate lense at this time. Family remains at bedside.

## 2018-07-14 NOTE — ED Notes (Signed)
Pt remains stable and talking to family at the bedside. Pt continues to have KelloggMorgan Lense in left eye. Bed locked and in lowest position. NAD at this time and pt updated as best as possible.
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
## 2018-07-14 NOTE — ED Notes (Signed)
Patient attempting to irrigate eyes. Unable to tolerate continuous stream.

## 2018-07-14 NOTE — ED Notes (Signed)
Pt has morgan lense in left eye currently to irrigate. Pt tolerating well at this time. Pt requesting pain medications. MD made aware.

## 2020-08-13 ENCOUNTER — Other Ambulatory Visit: Payer: Self-pay | Admitting: Family

## 2020-08-13 DIAGNOSIS — R7989 Other specified abnormal findings of blood chemistry: Secondary | ICD-10-CM

## 2020-08-15 ENCOUNTER — Ambulatory Visit: Payer: Self-pay

## 2020-08-22 ENCOUNTER — Ambulatory Visit: Payer: Self-pay | Attending: Family

## 2020-09-03 ENCOUNTER — Ambulatory Visit: Payer: Self-pay

## 2021-01-08 ENCOUNTER — Ambulatory Visit: Payer: Self-pay | Attending: Family

## 2023-05-23 ENCOUNTER — Encounter (HOSPITAL_COMMUNITY): Payer: Self-pay | Admitting: Emergency Medicine

## 2023-05-23 ENCOUNTER — Inpatient Hospital Stay (HOSPITAL_COMMUNITY)
Admission: EM | Admit: 2023-05-23 | Discharge: 2023-05-29 | DRG: 871 | Disposition: A | Discharge status: Home / Self Care | Payer: Self-pay | Attending: Internal Medicine | Admitting: Internal Medicine

## 2023-05-23 ENCOUNTER — Inpatient Hospital Stay (HOSPITAL_COMMUNITY): Payer: Self-pay

## 2023-05-23 ENCOUNTER — Emergency Department (HOSPITAL_COMMUNITY): Payer: Self-pay

## 2023-05-23 ENCOUNTER — Other Ambulatory Visit: Payer: Self-pay

## 2023-05-23 DIAGNOSIS — D849 Immunodeficiency, unspecified: Secondary | ICD-10-CM | POA: Diagnosis present

## 2023-05-23 DIAGNOSIS — R7881 Bacteremia: Secondary | ICD-10-CM | POA: Diagnosis present

## 2023-05-23 DIAGNOSIS — F909 Attention-deficit hyperactivity disorder, unspecified type: Secondary | ICD-10-CM | POA: Diagnosis present

## 2023-05-23 DIAGNOSIS — F1090 Alcohol use, unspecified, uncomplicated: Secondary | ICD-10-CM | POA: Diagnosis present

## 2023-05-23 DIAGNOSIS — R7989 Other specified abnormal findings of blood chemistry: Secondary | ICD-10-CM

## 2023-05-23 DIAGNOSIS — K760 Fatty (change of) liver, not elsewhere classified: Secondary | ICD-10-CM | POA: Diagnosis present

## 2023-05-23 DIAGNOSIS — R1084 Generalized abdominal pain: Principal | ICD-10-CM

## 2023-05-23 DIAGNOSIS — E871 Hypo-osmolality and hyponatremia: Secondary | ICD-10-CM | POA: Diagnosis present

## 2023-05-23 DIAGNOSIS — R748 Abnormal levels of other serum enzymes: Secondary | ICD-10-CM

## 2023-05-23 DIAGNOSIS — A403 Sepsis due to Streptococcus pneumoniae: Principal | ICD-10-CM | POA: Diagnosis present

## 2023-05-23 DIAGNOSIS — M545 Low back pain, unspecified: Secondary | ICD-10-CM | POA: Diagnosis present

## 2023-05-23 DIAGNOSIS — A419 Sepsis, unspecified organism: Secondary | ICD-10-CM | POA: Diagnosis present

## 2023-05-23 DIAGNOSIS — D72829 Elevated white blood cell count, unspecified: Secondary | ICD-10-CM

## 2023-05-23 DIAGNOSIS — Z6835 Body mass index (BMI) 35.0-35.9, adult: Secondary | ICD-10-CM

## 2023-05-23 DIAGNOSIS — I959 Hypotension, unspecified: Secondary | ICD-10-CM

## 2023-05-23 DIAGNOSIS — R6521 Severe sepsis with septic shock: Secondary | ICD-10-CM | POA: Diagnosis present

## 2023-05-23 DIAGNOSIS — Z9081 Acquired absence of spleen: Secondary | ICD-10-CM

## 2023-05-23 DIAGNOSIS — E669 Obesity, unspecified: Secondary | ICD-10-CM | POA: Diagnosis present

## 2023-05-23 DIAGNOSIS — G8929 Other chronic pain: Secondary | ICD-10-CM | POA: Diagnosis present

## 2023-05-23 DIAGNOSIS — F17290 Nicotine dependence, other tobacco product, uncomplicated: Secondary | ICD-10-CM | POA: Diagnosis present

## 2023-05-23 DIAGNOSIS — K029 Dental caries, unspecified: Secondary | ICD-10-CM | POA: Diagnosis present

## 2023-05-23 DIAGNOSIS — J45909 Unspecified asthma, uncomplicated: Secondary | ICD-10-CM | POA: Diagnosis present

## 2023-05-23 DIAGNOSIS — D580 Hereditary spherocytosis: Secondary | ICD-10-CM | POA: Diagnosis present

## 2023-05-23 LAB — LACTIC ACID, PLASMA
Lactic Acid, Venous: 2.8 mmol/L (ref 0.5–1.9)
Lactic Acid, Venous: 1.8 mmol/L (ref 0.5–1.9)

## 2023-05-23 LAB — COMPREHENSIVE METABOLIC PANEL
ALT: 40 U/L (ref 0–44)
AST: 55 U/L — ABNORMAL HIGH (ref 15–41)
Albumin: 3.8 g/dL (ref 3.5–5.0)
Alkaline Phosphatase: 45 U/L (ref 38–126)
Anion gap: 17 — ABNORMAL HIGH (ref 5–15)
BUN: 18 mg/dL (ref 6–20)
CO2: 17 mmol/L — ABNORMAL LOW (ref 22–32)
Calcium: 9.1 mg/dL (ref 8.9–10.3)
Chloride: 101 mmol/L (ref 98–111)
Creatinine, Ser: 1.15 mg/dL (ref 0.61–1.24)
GFR, Estimated: >60 mL/min (ref >60)
Glucose, Bld: 106 mg/dL — ABNORMAL HIGH (ref 70–99)
Potassium: 3.7 mmol/L (ref 3.5–5.1)
Sodium: 135 mmol/L (ref 135–145)
Total Bilirubin: 2.9 mg/dL — ABNORMAL HIGH (ref 0.3–1.2)
Total Protein: 7 g/dL (ref 6.5–8.1)

## 2023-05-23 LAB — RESPIRATORY PANEL BY PCR

## 2023-05-23 LAB — URINALYSIS, ROUTINE W REFLEX MICROSCOPIC
Bilirubin Urine: NEGATIVE
Glucose, UA: NEGATIVE mg/dL
Hgb urine dipstick: NEGATIVE
Ketones, ur: NEGATIVE mg/dL
Leukocytes,Ua: NEGATIVE
Nitrite: NEGATIVE
Protein, ur: NEGATIVE mg/dL
Specific Gravity, Urine: 1.023 (ref 1.005–1.030)
pH: 7 (ref 5.0–8.0)

## 2023-05-23 LAB — EXPECTORATED SPUTUM ASSESSMENT W GRAM STAIN, RFLX TO RESP C

## 2023-05-23 LAB — CBC
HCT: 42 % (ref 39.0–52.0)
Hemoglobin: 15.1 g/dL (ref 13.0–17.0)
MCH: 32.3 pg (ref 26.0–34.0)
MCHC: 36 g/dL (ref 30.0–36.0)
MCV: 89.9 fL (ref 80.0–100.0)
Platelets: 384 10*3/uL (ref 150–400)
RBC: 4.67 MIL/uL (ref 4.22–5.81)
RDW: 13.2 % (ref 11.5–15.5)
WBC: 29.7 10*3/uL — ABNORMAL HIGH (ref 4.0–10.5)
nRBC: 0 % (ref 0.0–0.2)

## 2023-05-23 LAB — PROTIME-INR
INR: 1.3 — ABNORMAL HIGH (ref 0.8–1.2)
Prothrombin Time: 16.1 seconds — ABNORMAL HIGH (ref 11.4–15.2)

## 2023-05-23 LAB — CULTURE, RESPIRATORY W GRAM STAIN

## 2023-05-23 LAB — MRSA NEXT GEN BY PCR, NASAL: MRSA by PCR Next Gen: NOT DETECTED

## 2023-05-23 LAB — LIPASE, BLOOD: Lipase: 29 U/L (ref 11–51)

## 2023-05-23 LAB — GLUCOSE, CAPILLARY: Glucose-Capillary: 109 mg/dL — ABNORMAL HIGH (ref 70–99)

## 2023-05-23 MED ORDER — HYDROMORPHONE HCL 1 MG/ML IJ SOLN
0.5000 mg | INTRAMUSCULAR | Status: DC | PRN
  Administered 2023-05-23 – 2023-05-24 (×2): 0.5 mg via INTRAVENOUS
  Filled 2023-05-23 (×2): qty 0.5

## 2023-05-23 MED ORDER — DOCUSATE SODIUM 100 MG PO CAPS: 100.0000 mg | ORAL_CAPSULE | Freq: Two times a day (BID) | ORAL | Status: DC | PRN

## 2023-05-23 MED ORDER — SODIUM CHLORIDE 0.9 % IV SOLN
250.0000 mL | INTRAVENOUS | Status: DC
  Administered 2023-05-23: 250 mL via INTRAVENOUS

## 2023-05-23 MED ORDER — VANCOMYCIN HCL 1500 MG/300ML IV SOLN
1500.0000 mg | Freq: Two times a day (BID) | INTRAVENOUS | Status: DC
  Administered 2023-05-24: 1500 mg via INTRAVENOUS
  Filled 2023-05-23 (×3): qty 300

## 2023-05-23 MED ORDER — NOREPINEPHRINE 4 MG/250ML-% IV SOLN
2.0000 ug/min | INTRAVENOUS | Status: DC
  Administered 2023-05-23: 2 ug/min via INTRAVENOUS
  Filled 2023-05-23: qty 250

## 2023-05-23 MED ORDER — LACTATED RINGERS IV SOLN: INTRAVENOUS | Status: DC

## 2023-05-23 MED ORDER — POLYETHYLENE GLYCOL 3350 17 G PO PACK: 17.0000 g | PACK | Freq: Every day | ORAL | Status: DC | PRN

## 2023-05-23 MED ORDER — VANCOMYCIN HCL 2000 MG/400ML IV SOLN
2000.0000 mg | Freq: Once | INTRAVENOUS | Status: AC
  Administered 2023-05-23: 2000 mg via INTRAVENOUS
  Filled 2023-05-23 (×2): qty 400

## 2023-05-23 MED ORDER — MORPHINE SULFATE (PF) 4 MG/ML IV SOLN
4.0000 mg | Freq: Once | INTRAVENOUS | Status: AC
  Administered 2023-05-23: 4 mg via INTRAVENOUS
  Filled 2023-05-23: qty 1

## 2023-05-23 MED ORDER — ONDANSETRON HCL 4 MG/2ML IJ SOLN
4.0000 mg | Freq: Once | INTRAMUSCULAR | Status: AC
  Administered 2023-05-23: 4 mg via INTRAVENOUS
  Filled 2023-05-23: qty 2

## 2023-05-23 MED ORDER — SODIUM CHLORIDE 0.9 % IV BOLUS
1000.0000 mL | Freq: Once | INTRAVENOUS | Status: AC
  Administered 2023-05-23: 1000 mL via INTRAVENOUS

## 2023-05-23 MED ORDER — IOHEXOL 350 MG/ML SOLN
75.0000 mL | Freq: Once | INTRAVENOUS | Status: AC | PRN
  Administered 2023-05-23: 75 mL via INTRAVENOUS

## 2023-05-23 MED ORDER — LIDOCAINE 5 % EX PTCH
1.0000 | MEDICATED_PATCH | CUTANEOUS | Status: DC
  Administered 2023-05-23 – 2023-05-24 (×2): 1 via TRANSDERMAL
  Filled 2023-05-23 (×5): qty 1

## 2023-05-23 MED ORDER — IOHEXOL 9 MG/ML PO SOLN
500.0000 mL | ORAL | Status: AC
  Administered 2023-05-23 (×2): 500 mL via ORAL

## 2023-05-23 MED ORDER — CEFEPIME HCL 2 G IV SOLR
2.0000 g | Freq: Three times a day (TID) | INTRAVENOUS | Status: DC
  Administered 2023-05-23 – 2023-05-24 (×3): 2 g via INTRAVENOUS
  Filled 2023-05-23 (×3): qty 12.5

## 2023-05-23 MED ORDER — METRONIDAZOLE 500 MG/100ML IV SOLN
500.0000 mg | Freq: Once | INTRAVENOUS | Status: AC
  Administered 2023-05-23: 500 mg via INTRAVENOUS
  Filled 2023-05-23: qty 100

## 2023-05-23 MED ORDER — CHLORHEXIDINE GLUCONATE CLOTH 2 % EX PADS
6.0000 | MEDICATED_PAD | Freq: Every day | CUTANEOUS | Status: DC
  Administered 2023-05-23 – 2023-05-27 (×5): 6 via TOPICAL

## 2023-05-23 MED ORDER — HEPARIN SODIUM (PORCINE) 5000 UNIT/ML IJ SOLN
5000.0000 [IU] | Freq: Three times a day (TID) | INTRAMUSCULAR | Status: DC
  Administered 2023-05-23 – 2023-05-25 (×5): 5000 [IU] via SUBCUTANEOUS
  Filled 2023-05-23 (×6): qty 1

## 2023-05-23 MED ORDER — ACETAMINOPHEN 325 MG PO TABS
650.0000 mg | ORAL_TABLET | Freq: Four times a day (QID) | ORAL | Status: DC | PRN
  Administered 2023-05-23 – 2023-05-24 (×2): 650 mg via ORAL
  Filled 2023-05-23 (×3): qty 2

## 2023-05-23 NOTE — Progress Notes (Signed)
Pharmacy Antibiotic Note  Frank Bell is a 32 y.o. male for which pharmacy has been consulted for cefepime and vancomycin dosing for sepsis.  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 Patient presenting with N/V, diffuse abdominal pain.  SCr 1.15 WBC 29.7; LA 2.8; T 98.1; HR 62; RR 14  Plan: Metronidazole per MD Cefepime 2g q8hr  Vancomycin 2000 mg once then 1500 mg q12hr (eAUC 506.9) unless change in renal function Monitor WBC, fever, renal function, cultures De-escalate when able Levels at steady state  Height: 6\' 4"  (193 cm) Weight: 122.5 kg (270 lb) IBW/kg (Calculated) : 86.8  Temp (24hrs), Avg:98.1 F (36.7 C), Min:98.1 F (36.7 C), Max:98.1 F (36.7 C)  Recent Labs  Lab 05/23/23 1319  WBC 29.7*  CREATININE 1.15  LATICACIDVEN 2.8*    Estimated Creatinine Clearance: 129.4 mL/min (by C-G formula based on SCr of 1.15 mg/dL).    No Known Allergies  Microbiology results: Pending  Thank you for allowing pharmacy to be a part of this patient's care.  Delmar Landau, PharmD, BCPS 05/23/2023 3:01 PM ED Clinical Pharmacist -  [phone]

## 2023-05-23 NOTE — Progress Notes (Addendum)
eLink Physician-Brief Progress Note Patient Name: Frank Bell DOB: Nov 18, 1991 MRN: [id number]   Date of Service  05/23/2023  HPI/Events of Note  Notified of fever with Tmax 102F.  Pt is on antibiotics.  Pt also with no maintenance fluids.   LR now within normal limits.  AST 55.   eICU Interventions  Tylenol ordered.  Start on LR @ 75cc/hr.      Intervention Category Intermediate Interventions: Infection - evaluation and management  Larinda Buttery 05/23/2023, 8:29 PM  10:40 PM Pt complaining of back pain.  He has chronic back pain and is on tramadol and flexeril at home.  He is currently NPO.   Plan> Dilaudid IV PRN ordered.

## 2023-05-23 NOTE — ED Triage Notes (Signed)
Per [facility] pt coming from home c/o N/V abdominal pain and weakness onset of waking up this morning. Given 250 CC NS en route. 20G LFA. Reports having a fever this morning when waking. Describes it as everything from his head down hurts "feels like something is eating my bones."

## 2023-05-23 NOTE — ED Provider Notes (Signed)
[place] EMERGENCY DEPARTMENT AT [facility] Provider Note   CSN: [id number] Arrival date & time: 05/23/23  1303     History  Chief Complaint  Patient presents with   Abdominal Pain    Frank Bell is a 32 y.o. male.  32 year old male with prior medical history as detailed below presents for evaluation.  Patient reports acute onset of nausea, vomiting, diffuse abdominal pain with radiation of the pain to his mid low back.  Symptoms began this morning.  Patient is groaning and lying on the stretcher complaining that "everything hurts".  Patient with prior distant history significant for splenectomy.  Patient with history of hereditary spherocytosis.  Patient has been previously advised that he should take prophylactic antibiotics given history of splenectomy.  However, he does not do this.  Patient reports a fever at home.  This started this morning.    The history is provided by the patient and medical records.       Home Medications Prior to Admission medications   Medication Sig Start Date End Date Taking? Authorizing Provider  chlorpheniramine-HYDROcodone (TUSSIONEX PENNKINETIC ER) 10-8 MG/5ML SUER Take 5 mLs by mouth 2 (two) times daily. 02/17/16   Rebecka Apley, MD  cyclobenzaprine (FLEXERIL) 10 MG tablet Take 1 tablet (10 mg total) by mouth 3 (three) times daily as needed for muscle spasms. Patient not taking: Reported on 02/16/2016 07/26/13   Ivonne Andrew, PA-C  ibuprofen (ADVIL,MOTRIN) 800 MG tablet Take 800 mg by mouth every 8 (eight) hours as needed for pain.    [provider]  traMADol (ULTRAM) 50 MG tablet Take 1 tablet (50 mg total) by mouth every 8 (eight) hours as needed for pain. Patient not taking: Reported on 02/16/2016 07/26/13   Judy Pimple, MD      Allergies    Patient has no known allergies.    Review of Systems   Review of Systems  All other systems reviewed and are negative.   Physical Exam Updated Vital  Signs BP (!) 149/129 (BP Location: Left Arm)   Pulse 62   Temp 98.1 F (36.7 C)   Resp 14   Ht 6\' 4"  (1.93 m)   Wt 122.5 kg   SpO2 99%   BMI 32.87 kg/m  Physical Exam Vitals and nursing note reviewed.  Constitutional:      General: He is not in acute distress.    Appearance: Normal appearance. He is well-developed.  HENT:     Head: Normocephalic and atraumatic.  Eyes:     Conjunctiva/sclera: Conjunctivae normal.     Pupils: Pupils are equal, round, and reactive to light.  Cardiovascular:     Rate and Rhythm: Normal rate and regular rhythm.     Heart sounds: Normal heart sounds.  Pulmonary:     Effort: Pulmonary effort is normal. No respiratory distress.     Breath sounds: Normal breath sounds.  Abdominal:     General: There is no distension.     Palpations: Abdomen is soft.     Tenderness: There is generalized abdominal tenderness.  Musculoskeletal:        General: No deformity. Normal range of motion.     Cervical back: Normal range of motion and neck supple.  Skin:    General: Skin is warm and dry.  Neurological:     General: No focal deficit present.     Mental Status: He is alert and oriented to person, place, and time.     ED  Results / Procedures / Treatments   Labs (all labs ordered are listed, but only abnormal results are displayed) Labs Reviewed  COMPREHENSIVE METABOLIC PANEL - Abnormal; Notable for the following components:      Result Value   CO2 17 (*)    Glucose, Bld 106 (*)    AST 55 (*)    Total Bilirubin 2.9 (*)    Anion gap 17 (*)    All other components within normal limits  CBC - Abnormal; Notable for the following components:   WBC 29.7 (*)    All other components within normal limits  LACTIC ACID, PLASMA - Abnormal; Notable for the following components:   Lactic Acid, Venous 2.8 (*)    All other components within normal limits  CULTURE, BLOOD (ROUTINE X 2)  CULTURE, BLOOD (ROUTINE X 2)  RESPIRATORY PANEL BY PCR  LIPASE, BLOOD   URINALYSIS, ROUTINE W REFLEX MICROSCOPIC  LACTIC ACID, PLASMA  PROTIME-INR  HIV ANTIBODY (ROUTINE TESTING W REFLEX)    EKG EKG Interpretation  Date/Time:  Saturday May 23 2023 13:10:42 EDT Ventricular Rate:  102 PR Interval:  156 QRS Duration: 92 QT Interval:  324 QTC Calculation: 422 R Axis:   -20 Text Interpretation: Sinus tachycardia Otherwise normal ECG No previous ECGs available Confirmed by Kristine Royal [phone]) on 05/23/2023 1:40:19 PM  Radiology No results found.  Procedures Procedures    Medications Ordered in ED Medications  sodium chloride 0.9 % bolus 1,000 mL (has no administration in time range)  morphine (PF) 4 MG/ML injection 4 mg (has no administration in time range)  ondansetron (ZOFRAN) injection 4 mg (has no administration in time range)    ED Course/ Medical Decision Making/ A&P                             Medical Decision Making Amount and/or Complexity of Data Reviewed Labs: ordered. Radiology: ordered.  Risk Prescription drug management. Decision regarding hospitalization.    Medical Screen Complete  This patient presented to the ED with complaint of malaise, fatigue, fever, abdominal pain, nausea, vomiting.  This complaint involves an extensive number of treatment options. The initial differential diagnosis includes, but is not limited to, bacterial versus viral infection, sepsis, other intra-abdominal pathology, etc.  This presentation is: Acute, Chronic, Self-Limited, Previously Undiagnosed, Uncertain Prognosis, Complicated, Systemic Symptoms, and Threat to Life/Bodily Function  Patient with history significant for prior splenectomy and not currently on prophylactic antibiotics presents with diffuse set of symptoms concerning for possible sepsis.  Onset of symptoms was this morning.  Patient is noted to have elevated white count at 30.  Initial lactic acid is elevated as well at 2.8.  Patient given broad-spectrum antibiotics.   IV fluid resuscitation initiated here in the ED.  Initially, patient was noted to be hypertensive however after returning from CT he was found to be hypotensive with systolic blood pressures into the 70s and 80s.  Obtained CT apparently did not have IV contrast.  No clear source of infection identified on CT imaging.  Critical care made aware of case and will evaluate for admission.  Additional history obtained:  External records from outside sources obtained and reviewed including prior ED visits and prior Inpatient records.    Lab Tests:  I ordered and personally interpreted labs.    Imaging Studies ordered:  I ordered imaging studies including CT chest abdomen pelvis I independently visualized and interpreted obtained imaging which showed NAD I agree  with the radiologist interpretation.   Cardiac Monitoring:  The patient was maintained on a cardiac monitor.  I personally viewed and interpreted the cardiac monitor which showed an underlying rhythm of: NSR   Medicines ordered:  I ordered medication including broad-spectrum antibiotics for suspected sepsis Reevaluation of the patient after these medicines showed that the patient: worsened    Problem List / ED Course:  Suspected sepsis, elevated white count, elevated lactic acid, hypotension   Reevaluation:  After the interventions noted above, I reevaluated the patient and found that they have: worsened   Disposition:  After consideration of the diagnostic results and the patients response to treatment, I feel that the patent would benefit from admission.   CRITICAL CARE Performed by: Wynetta Fines   Total critical care time: 30 minutes  Critical care time was exclusive of separately billable procedures and treating other patients.  Critical care was necessary to treat or prevent imminent or life-threatening deterioration.  Critical care was time spent personally by me on the following activities:  development of treatment plan with patient and/or surrogate as well as nursing, discussions with consultants, evaluation of patient's response to treatment, examination of patient, obtaining history from patient or surrogate, ordering and performing treatments and interventions, ordering and review of laboratory studies, ordering and review of radiographic studies, pulse oximetry and re-evaluation of patient's condition.          Final Clinical Impression(s) / ED Diagnoses Final diagnoses:  Generalized abdominal pain  Leukocytosis, unspecified type  Hypotension, unspecified hypotension type  Elevated lactic acid level    Rx / DC Orders ED Discharge Orders     None         Wynetta Fines, MD 05/23/23 1626

## 2023-05-23 NOTE — H&P (Signed)
NAME:  Frank Bell, MRN:  [id number], DOB:  1991/10/23, LOS: 0 ADMISSION DATE:  05/23/2023, CONSULTATION DATE:  05/23/2023 REFERRING MD:  Dr. 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 On ED arrival he is seen febrile with T-max 103.1 and mildly tachypneic. Labor significant for glucose 106, AST 55, total bilirubin 2.9, lactic 2.8, and WBC 29.7. CTA chest/abd/pelvis obtained and negative but no contrast was seen on exam therefore will likely need to be repeated. GIven evolving sepsis critical care consulted for further management and admission   Pertinent  Medical History  Asthma Pneumonia  Splenectomy   Significant Hospital Events: Including procedures, antibiotic start and stop dates in addition to other pertinent events   6/22 Presented septic with unknown source    Interim History / Subjective:  AS above   Objective   Blood pressure (!) 149/129, pulse 62, temperature 98.1 F (36.7 C), resp. rate 14, height 6\' 4"  (1.93 m), weight 122.5 kg, SpO2 99 %.       No intake or output data in the 24 hours ending 05/23/23 1548 Filed Weights   05/23/23 1309  Weight: 122.5 kg    Examination: Deferred to attending  Resolved Hospital Problem list     Assessment & Plan:  Sever sepsis  -Presented with fever, leukocytosis and elevated lctic acid  P: Admit ICU Supplemental oxygen as needed for stats greater than 92% Pan cultures prior to antibiotic Continue Cefepime and Vancomycin given hx of splenectomy   Aggressive IV hydration provided on arrival  If hypotension persist despite hydration will need pressors to maintain MAP< 65  Trend lactic acid Monitor urine output  Elevated  AST and bilirubin  Abdominal pain that radiates to the lower back with associated nausea/vomiting with  -CT scans on arrival negative but contrast was not seen on exam P: Repeat CT  PRN Antiemetics  If CT negative consider obtaining stool studies  Pain control  Consider ABD [place]   Best Practice (right click and "Reselect all SmartList Selections" daily)   Diet/type: NPO DVT prophylaxis: prophylactic heparin  GI prophylaxis: PPI Lines: N/A Foley:  N/A Code Status:  full code Last date of multidisciplinary goals of care discussion: Continue to update patient and family daily   Labs   CBC: Recent Labs  Lab 05/23/23 1319  WBC 29.7*  HGB 15.1  HCT 42.0  MCV 89.9  PLT 384    Basic Metabolic Panel: Recent Labs  Lab 05/23/23 1319  NA 135  K 3.7  CL 101  CO2 17*  GLUCOSE 106*  BUN 18  CREATININE 1.15  CALCIUM 9.1   GFR: Estimated Creatinine Clearance: 129.4 mL/min (by C-G formula based on SCr of 1.15 mg/dL). Recent Labs  Lab 05/23/23 1319  WBC 29.7*  LATICACIDVEN 2.8*    Liver Function Tests: Recent Labs  Lab 05/23/23 1319  AST 55*  ALT 40  ALKPHOS 45  BILITOT 2.9*  PROT 7.0  ALBUMIN 3.8   Recent Labs  Lab 05/23/23 1319  LIPASE 29   No results for input(s): "AMMONIA" in the last 168 hours.  ABG No results found for: "PHART", "PCO2ART", "PO2ART", "HCO3", "TCO2", "ACIDBASEDEF", "O2SAT"   Coagulation Profile: No results for input(s): "INR", "  PROTIME" in the last 168 hours.  Cardiac Enzymes: No results for input(s): "CKTOTAL", "CKMB", "CKMBINDEX", "TROPONINI" in the last 168 hours.  HbA1C: No results found for: "HGBA1C"  CBG: No results for input(s): "GLUCAP" in the last 168 hours.  Review of Systems:   Please see the history of present illness. All other systems reviewed and are negative    Past Medical History:  He,  has a past medical history of Asthma and Pneumonia.   Surgical History:   Past Surgical History:  Procedure  Laterality Date   CHOLECYSTECTOMY     SPLEENECTOMY       Social History:   reports that he has quit smoking. He has never used smokeless tobacco. He reports current alcohol use. He reports that he does not use drugs.   Family History:  His family history is not on file.   Allergies No Known Allergies   Home Medications  Prior to Admission medications   Medication Sig Start Date End Date Taking? Authorizing Provider  chlorpheniramine-HYDROcodone (TUSSIONEX PENNKINETIC ER) 10-8 MG/5ML SUER Take 5 mLs by mouth 2 (two) times daily. 02/17/16   Rebecka Apley, MD  cyclobenzaprine (FLEXERIL) 10 MG tablet Take 1 tablet (10 mg total) by mouth 3 (three) times daily as needed for muscle spasms. Patient not taking: Reported on 02/16/2016 07/26/13   Ivonne Andrew, PA-C  ibuprofen (ADVIL,MOTRIN) 800 MG tablet Take 800 mg by mouth every 8 (eight) hours as needed for pain.    [provider]  traMADol (ULTRAM) 50 MG tablet Take 1 tablet (50 mg total) by mouth every 8 (eight) hours as needed for pain. Patient not taking: Reported on 02/16/2016 07/26/13   Judy Pimple, MD     Critical care time:    Performed by: Sharlize Hoar D. Harris  Total critical care time: 40 minutes  Critical care time was exclusive of separately billable procedures and treating other patients.  Critical care was necessary to treat or prevent imminent or life-threatening deterioration.  Critical care was time spent personally by me on the following activities: development of treatment plan with patient and/or surrogate as well as nursing, discussions with consultants, evaluation of patient's response to treatment, examination of patient, obtaining history from patient or surrogate, ordering and performing treatments and interventions, ordering and review of laboratory studies, ordering and review of radiographic studies, pulse oximetry and re-evaluation of patient's condition.  Ebrahim Deremer D. Harris, NP-C [facility] Personal contact information can be found on Amion  If no contact or response made please call [phone] 05/23/2023, 4:39 PM

## 2023-05-23 NOTE — Progress Notes (Signed)
An USGPIV (ultrasound guided PIV) has been placed for short-term vasopressor infusion. A correctly placed ivWatch must be used when administering Vasopressors. Should this treatment be needed beyond 72 hours, central line access should be obtained.  It will be the responsibility of the bedside nurse to follow best practice to prevent extravasations.  HS Sabrena Gavitt RN 

## 2023-05-24 ENCOUNTER — Inpatient Hospital Stay (HOSPITAL_COMMUNITY): Payer: Self-pay

## 2023-05-24 LAB — HEPATIC FUNCTION PANEL
ALT: 35 U/L (ref 0–44)
AST: 42 U/L — ABNORMAL HIGH (ref 15–41)
Albumin: 2.9 g/dL — ABNORMAL LOW (ref 3.5–5.0)
Alkaline Phosphatase: 35 U/L — ABNORMAL LOW (ref 38–126)
Bilirubin, Direct: 0.4 mg/dL — ABNORMAL HIGH (ref 0.0–0.2)
Indirect Bilirubin: 1.3 mg/dL — ABNORMAL HIGH (ref 0.3–0.9)
Total Bilirubin: 1.7 mg/dL — ABNORMAL HIGH (ref 0.3–1.2)
Total Protein: 5.9 g/dL — ABNORMAL LOW (ref 6.5–8.1)

## 2023-05-24 LAB — BLOOD CULTURE ID PANEL (REFLEXED) - BCID2

## 2023-05-24 LAB — CULTURE, BLOOD (ROUTINE X 2)

## 2023-05-24 LAB — BASIC METABOLIC PANEL
Anion gap: 8 (ref 5–15)
BUN: 17 mg/dL (ref 6–20)
CO2: 18 mmol/L — ABNORMAL LOW (ref 22–32)
Calcium: 7.7 mg/dL — ABNORMAL LOW (ref 8.9–10.3)
Chloride: 108 mmol/L (ref 98–111)
Creatinine, Ser: 1.09 mg/dL (ref 0.61–1.24)
GFR, Estimated: >60 mL/min (ref >60)
Glucose, Bld: 110 mg/dL — ABNORMAL HIGH (ref 70–99)
Potassium: 3.9 mmol/L (ref 3.5–5.1)
Sodium: 134 mmol/L — ABNORMAL LOW (ref 135–145)

## 2023-05-24 LAB — MAGNESIUM: Magnesium: 1.5 mg/dL — ABNORMAL LOW (ref 1.7–2.4)

## 2023-05-24 LAB — STREP PNEUMONIAE URINARY ANTIGEN: Strep Pneumo Urinary Antigen: POSITIVE — AB

## 2023-05-24 LAB — CBC
HCT: 37.8 % — ABNORMAL LOW (ref 39.0–52.0)
Hemoglobin: 13.2 g/dL (ref 13.0–17.0)
MCH: 32 pg (ref 26.0–34.0)
MCHC: 34.9 g/dL (ref 30.0–36.0)
MCV: 91.7 fL (ref 80.0–100.0)
Platelets: 322 10*3/uL (ref 150–400)
RBC: 4.12 MIL/uL — ABNORMAL LOW (ref 4.22–5.81)
RDW: 13.9 % (ref 11.5–15.5)
WBC: 52.2 10*3/uL (ref 4.0–10.5)
nRBC: 0 % (ref 0.0–0.2)

## 2023-05-24 LAB — HIV ANTIBODY (ROUTINE TESTING W REFLEX): HIV Screen 4th Generation wRfx: NONREACTIVE

## 2023-05-24 LAB — PHOSPHORUS: Phosphorus: 3.6 mg/dL (ref 2.5–4.6)

## 2023-05-24 MED ORDER — MAGNESIUM SULFATE 2 GM/50ML IV SOLN
2.0000 g | Freq: Once | INTRAVENOUS | Status: AC
  Administered 2023-05-24: 2 g via INTRAVENOUS
  Filled 2023-05-24: qty 50

## 2023-05-24 MED ORDER — OXYCODONE HCL 5 MG PO TABS
5.0000 mg | ORAL_TABLET | ORAL | Status: DC | PRN
  Administered 2023-05-24: 5 mg via ORAL
  Filled 2023-05-24: qty 1

## 2023-05-24 MED ORDER — MORPHINE SULFATE (PF) 2 MG/ML IV SOLN
4.0000 mg | INTRAVENOUS | Status: DC | PRN
  Administered 2023-05-24 – 2023-05-25 (×3): 4 mg via INTRAVENOUS
  Filled 2023-05-24 (×3): qty 2

## 2023-05-24 MED ORDER — SODIUM CHLORIDE 0.9 % IV SOLN
2.0000 g | INTRAVENOUS | Status: DC
  Administered 2023-05-24 – 2023-05-26 (×3): 2 g via INTRAVENOUS
  Filled 2023-05-24 (×3): qty 20

## 2023-05-24 MED ORDER — NICOTINE 21 MG/24HR TD PT24
21.0000 mg | MEDICATED_PATCH | Freq: Every day | TRANSDERMAL | Status: DC
  Administered 2023-05-24 – 2023-05-29 (×6): 21 mg via TRANSDERMAL
  Filled 2023-05-24 (×6): qty 1

## 2023-05-24 NOTE — Progress Notes (Signed)
eLink Physician-Brief Progress Note Patient Name: Frank Bell DOB: September 17, 1991 MRN: [id number]   Date of Service  05/24/2023  HPI/Events of Note  Spoke to the patient's wife regarding CT scan results -  1. No abnormality to explain reported sepsis. 2. Fatty infiltration of the liver.  eICU Interventions  Explained to her that the CT was no revealing of possible focus of infection.  In the meantime, we are continuing empiric antibiotics. Will defer to the day team next steps in the management.     Intervention Category Minor Interventions: Communication with other healthcare providers and/or family Evaluation Type: Other  Larinda Buttery 05/24/2023, 1:27 AM

## 2023-05-24 NOTE — Progress Notes (Addendum)
eLink Physician-Brief Progress Note Patient Name: Frank Bell DOB: 07-22-1991 MRN: [id number]   Date of Service  05/24/2023  HPI/Events of Note  Pt complaining of bilateral LE pain, unrelieved by acetaminophen.  Had been on dilaudid earlier, but this has been discontinued due to hypotension.   eICU Interventions  Ordered trial of oxycodone 5mg  PO PRNnow.  Will follow response.         Bayard More M DELA CRUZ 05/24/2023, 7:43 PM  8:27 PM Nicotine patch ordered as pt craving cigarettes.   9:38 PM Oxycodone did not offer any relief from pain.  Placed order for morphine 4mg  IV PRN.   3:58 AM Morphine reported to be inadequate in controlling pain.  Will try dilaudid 0.5mg  IV PRN.  Monitor for development of hypotension

## 2023-05-24 NOTE — Progress Notes (Signed)
PHARMACY - PHYSICIAN COMMUNICATION CRITICAL VALUE ALERT - BLOOD CULTURE IDENTIFICATION (BCID)  Frank Bell is an 32 y.o. male who presented to [facility] on 05/23/2023 with a chief complaint of sepsis  Assessment:  1/4 strep pneumo   Name of physician (or Provider) Contacted: Dossie Arbour  Current antibiotics: Cefepime, vanc  Changes to prescribed antibiotics recommended:  Consider de-escalate to ceftriaxone 2g IV q 24h  Results for orders placed or performed during the hospital encounter of 05/23/23  Blood Culture ID Panel (Reflexed) (Collected: 05/23/2023  3:04 PM)  Result Value Ref Range   Enterococcus faecalis NOT DETECTED NOT DETECTED   Enterococcus Faecium NOT DETECTED NOT DETECTED   Listeria monocytogenes NOT DETECTED NOT DETECTED   Staphylococcus species NOT DETECTED NOT DETECTED   Staphylococcus aureus (BCID) NOT DETECTED NOT DETECTED   Staphylococcus epidermidis NOT DETECTED NOT DETECTED   Staphylococcus lugdunensis NOT DETECTED NOT DETECTED   Streptococcus species DETECTED (A) NOT DETECTED   Streptococcus agalactiae NOT DETECTED NOT DETECTED   Streptococcus pneumoniae DETECTED (A) NOT DETECTED   Streptococcus pyogenes NOT DETECTED NOT DETECTED   A.calcoaceticus-baumannii NOT DETECTED NOT DETECTED   Bacteroides fragilis NOT DETECTED NOT DETECTED   Enterobacterales NOT DETECTED NOT DETECTED   Enterobacter cloacae complex NOT DETECTED NOT DETECTED   Escherichia coli NOT DETECTED NOT DETECTED   Klebsiella aerogenes NOT DETECTED NOT DETECTED   Klebsiella oxytoca NOT DETECTED NOT DETECTED   Klebsiella pneumoniae NOT DETECTED NOT DETECTED   Proteus species NOT DETECTED NOT DETECTED   Salmonella species NOT DETECTED NOT DETECTED   Serratia marcescens NOT DETECTED NOT DETECTED   Haemophilus influenzae NOT DETECTED NOT DETECTED   Neisseria meningitidis NOT DETECTED NOT DETECTED   Pseudomonas aeruginosa NOT DETECTED NOT DETECTED   Stenotrophomonas maltophilia NOT DETECTED  NOT DETECTED   Candida albicans NOT DETECTED NOT DETECTED   Candida auris NOT DETECTED NOT DETECTED   Candida glabrata NOT DETECTED NOT DETECTED   Candida krusei NOT DETECTED NOT DETECTED   Candida parapsilosis NOT DETECTED NOT DETECTED   Candida tropicalis NOT DETECTED NOT DETECTED   Cryptococcus neoformans/gattii NOT DETECTED NOT DETECTED    Calton Dach, PharmD Clinical Pharmacist 05/24/2023 7:14 AM

## 2023-05-24 NOTE — Progress Notes (Addendum)
eLink Physician-Brief Progress Note Patient Name: Frank Bell DOB: [birth year] MRN: [id number]   Date of Service  05/24/2023  HPI/Events of Note  Notified of rising WBC at 52.2.  Mg 1.5, crea 1.09, K 3.9.  eICU Interventions  Replete Mg.  2g IV magnesium sulfate ordered.     Intervention Category Intermediate Interventions: Electrolyte abnormality - evaluation and management  Larinda Buttery 05/24/2023, 2:21 AM  2:47 AM Notified by the wife that the patient has decaying tooth and wondered whether this could be the culprit.   Plan> Await blood culture results.  Consider dental consult while in-patient.

## 2023-05-24 NOTE — Progress Notes (Signed)
NAME:  Frank Bell, MRN:  [id number], DOB:  Apr 18, 1991, LOS: 1 ADMISSION DATE:  05/23/2023, CONSULTATION DATE:  05/23/2023 REFERRING MD:  Dr. 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 On ED arrival he is seen febrile with T-max 103.1 and mildly tachypneic. Labor significant for glucose 106, AST 55, total bilirubin 2.9, lactic 2.8, and WBC 29.7. CTA chest/abd/pelvis obtained and negative but no contrast was seen on exam therefore will likely need to be repeated. GIven evolving sepsis critical care consulted for further management and admission   Pertinent  Medical History  Asthma Pneumonia  Splenectomy   Significant Hospital Events: Including procedures, antibiotic start and stop dates in addition to other pertinent events   6/22 Presented septic with unknown source   6/23 1/4 blood cultures positive for strep pneumo, leukocytosis worsened to 52.2.  ID consulted  Interim History / Subjective:  Continues to report he feels globally unwell but slightly better than yesterday Wife updated at bedside  Objective   Blood pressure (!) 101/59, pulse 75, temperature 98.8 F (37.1 C), temperature source Oral, resp. rate 20, height 6\' 4"  (1.93 m), weight 131.3 kg, SpO2 96 %.        Intake/Output Summary (Last 24 hours) at 05/24/2023 0757 Last data filed at 05/24/2023 0700 Gross per 24 hour  Intake 4700.67 ml  Output 2000 ml  Net 2700.67 ml   Filed Weights   05/23/23 1309 05/23/23 1745 05/24/23 0500  Weight: 122.5 kg 131.9 kg 131.3 kg    Examination: General: Acute ill-appearing adult male lying in bed in no acute distress HEENT: /AT, MM pink/moist, PERRL,  Neuro: Alert and oriented  x3, nonfocal  CV: s1s2 regular rate and rhythm, no murmur, rubs, or gallops,  PULM: Clear to auscultation bilaterally, no increased work of breathing, no added breath sounds GI: soft, bowel sounds active in all 4 quadrants, non-tender, non-distended Extremities: warm/dry, no edema  Skin: no rashes or lesions  Resolved Hospital Problem list     Assessment & Plan:  Sever sepsis in the setting of strep pneumoniae bacteremia -Presented with fever, leukocytosis and elevated lctic acid  -1/4 Blood cultures positive as of 6/23 P: ID consulted Obtain echocardiogram Close monitoring in the ICU setting Continue supplemental oxygen as needed for sats greater than 92 Follow cultures Continue cefepime and vancomycin given immunosuppression Vasopressors currently weaned off MAP goal greater than 65  Elevated AST and bilirubin  Abdominal pain that radiates to the lower back with associated nausea/vomiting with  -CT scans on arrival negative but contrast was not seen on exam -Repeat CT scan negative P: Repeat liver panel this a.m. As needed antiemetics Ensure adequate pain control  Dental caries -Patient and wife report likely dental caries with history of abscess.  On exam a.m. of 6/23 no active abscesses or signs of infection seen.  Likely underlying dental caries P: Will need outpatient dental follow up   Best Practice (right click and "Reselect all SmartList Selections" daily)   Diet/type: NPO DVT prophylaxis: prophylactic heparin  GI prophylaxis: PPI Lines: N/A Foley:  N/A Code Status:  full code Last date of multidisciplinary goals of care discussion: Continue to update patient and family daily   Critical care  time:    Performed by: Bryce Kimble Bingaman D. Harris  Total critical care time: 37 minutes  Critical care time was exclusive of separately billable procedures and treating other patients.  Critical care was necessary to treat or prevent imminent or life-threatening  deterioration.  Critical care was time spent personally by me on the following activities: development of treatment plan with patient and/or surrogate as well as nursing, discussions with consultants, evaluation of patient's response to treatment, examination of patient, obtaining history from patient or surrogate, ordering and performing treatments and interventions, ordering and review of laboratory studies, ordering and review of radiographic studies, pulse oximetry and re-evaluation of patient's condition.  Nico Rogness D. Harris, NP-C [facility] Personal contact information can be found on Amion  If no contact or response made please call [phone] 05/24/2023, 7:57 AM

## 2023-05-24 NOTE — Consult Note (Signed)
[facility]                                                                                        Patient Identification: Patient Name: Frank Bell MRN: [id number] Admit Date: 05/23/2023  1:03 PM Today's Date: 05/24/2023 Reason for consult: bacteremia  Requesting provider: Dr Celine Mans   Principal Problem:   Septic shock (HCC)   Antibiotics:  Vancomycin 6/22 Cefepime 6/22 Metronidazole 6/22  Lines/Hardware:  Assessment 32 year old male with history of asthma, alcohol use, vaping, hereditary spherocytosis status post splenectomy with recurrent episodes of sepsis who presented to the ED on 6/22 with nausea, vomiting, diffuse abdominal pain fever/chills and weakness/generalized body ache after waking up in the morning.   # Septic shock - resolved 2/2  # Strep pneumoniae bacteremia: unclear cause, h/o tooth decay, distant h/o snorting cocaine. No PNA in CT/No signs of meningitis.  Risk factor - splenectomy # Leukocytosis - likely related to burden of disease and expect to downtrend with abtx   Recommendations  DC Vancomycin, switch cefepime to ceftriaxone  Repeat 2 sets of peripheral blood cultures for tomorrow morning ordered  Fu TTE  Monitor for back pain to see if advanced imaging needed ( back pain is mostly rt lower lateral back starting from groin) Fu Orthopantogram Fu blood cultures and respiratory cultures  Dr Thedore Mins to follow starting tomorrow   Rest of the management as per the primary team. Please call with questions or concerns.  Thank you for the consult  __________________________________________________________________________________________________________ HPI and Hospital Course: 32 year old male with history of asthma, alcohol use, vaping, hereditary spherocytosis status post splenectomy with recurrent episodes of sepsis who presented to the ED on 6/22 with nausea,  vomiting, diffuse abdominal pain fever/chills and weakness/generalized body ache after waking up in the morning.  Reports he was completely at his baseline health day ago.  Denies any known sick contacts.  He works in heavy lifting.  Vapes daily per wife as well as alcohol use.  Reports a distant history of snorting cocaine but denies h/o IVDU.  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   Complains of on and off back pain which starts on his right groin and circles around right back.  Denies any numbness weakness or tingling.  Denies Bowel bladder incontinence.  Wife concerned about tooth decay but denies dental or gum pain. Has not seen dentist recently.   Was given to 50 cc NS en route At ED febrile, tachypneic Labs remarkable for AST 55, TB 2.9, lactic acid 2.8, WBC 29.7 RVP panel negative 6/22 blood cultures both sets GPC, BC ID as strep pneumonia 6/23 HIV negative Imaging as below   ROS: General- Denies loss of appetite and loss of weight HEENT - Denies headache, blurry vision, neck pain, sinus pain Chest - Denies any chest pain, SOB or cough CVS- Denies any dizziness/lightheadedness, syncopal attacks, palpitations Abdomen- Denies hematochezia and diarrhea Neuro - Denies any weakness, numbness, tingling sensation Psych - Denies any changes in mood irritability or depressive symptoms GU- Denies any burning, dysuria, hematuria or increased  frequency of urination Skin - denies any rashes/lesions MSK - denies any joint pain/swelling or restricted ROM   Past Medical History:  Diagnosis Date   Asthma    Pneumonia    Past Surgical History:  Procedure Laterality Date   CHOLECYSTECTOMY     SPLEENECTOMY     Scheduled Meds:  Chlorhexidine Gluconate Cloth  6 each Topical Daily   heparin  5,000 Units Subcutaneous Q8H   lidocaine  1 patch Transdermal Q24H   Continuous Infusions:  sodium chloride 10 mL/hr at 05/24/23 0800   ceFEPime  (MAXIPIME) IV Stopped (05/24/23 0646)   lactated ringers 75 mL/hr at 05/24/23 0800   norepinephrine (LEVOPHED) Adult infusion Stopped (05/24/23 0865)   vancomycin     PRN Meds:.acetaminophen, docusate sodium, HYDROmorphone (DILAUDID) injection, polyethylene glycol  No Known Allergies  Social History   Socioeconomic History   Marital status: Single    Spouse name: Not on file   Number of children: Not on file   Years of education: Not on file   Highest education level: Not on file  Occupational History   Not on file  Tobacco Use   Smoking status: Former   Smokeless tobacco: Never  Substance and Sexual Activity   Alcohol use: Yes    Comment: occ   Drug use: No   Sexual activity: Not on file  Other Topics Concern   Not on file  Social History Narrative   Not on file   Social Determinants of Health   Financial Resource Strain: Not on file  Food Insecurity: Not on file  Transportation Needs: Not on file  Physical Activity: Not on file  Stress: Not on file  Social Connections: Not on file  Intimate Partner Violence: Not on file   History reviewed. No pertinent family history.  Vitals BP (!) 105/56 (BP Location: Left Arm)   Pulse 73   Temp 98.8 F (37.1 C) (Oral)   Resp 13   Ht 6\' 4"  (1.93 m)   Wt 131.3 kg   SpO2 96%   BMI 35.23 kg/m    Physical Exam Constitutional: Adult male sitting in the bed    Comments: HEENT WNL  Cardiovascular:     Rate and Rhythm: Normal rate and regular rhythm.     Heart sounds: S1 and S2   Pulmonary:     Effort: Pulmonary effort is normal on [place]    Comments: Bilateral equal air entry  Abdominal:     Palpations: Abdomen is soft.     Tenderness: Nondistended and nontender  Musculoskeletal:        General: No swelling or tenderness in peripheral joints.  No signs of septic joint.  No spinal tenderness  Skin:    Comments: No rashes  Neurological:     General:, Alert and oriented, follows commands  Psychiatric:         Mood and Affect: Mood normal.    Pertinent Microbiology Results for orders placed or performed during the hospital encounter of 05/23/23  Culture, blood (routine x 2)     Status: None (Preliminary result)   Collection Time: 05/23/23  3:04 PM   Specimen: BLOOD RIGHT FOREARM  Result Value Ref Range Status   Specimen Description BLOOD RIGHT FOREARM  Final   Special Requests   Final    BOTTLES DRAWN AEROBIC AND ANAEROBIC Blood Culture results may not be optimal due to an excessive volume of blood received in culture bottles   Culture  Setup Time   Final  GRAM POSITIVE COCCI ANAEROBIC BOTTLE ONLY CRITICAL RESULT CALLED TO, READ BACK BY AND VERIFIED WITH: PHARMD V. BRYK 05/24/23 @ 0709 BY AB Performed at [facility], [address], [place], [place] [address]    Culture GRAM POSITIVE COCCI  Final   Report Status PENDING  Incomplete  Blood Culture ID Panel (Reflexed)     Status: Abnormal   Collection Time: 05/23/23  3:04 PM  Result Value Ref Range Status   Enterococcus faecalis NOT DETECTED NOT DETECTED Final   Enterococcus Faecium NOT DETECTED NOT DETECTED Final   Listeria monocytogenes NOT DETECTED NOT DETECTED Final   Staphylococcus species NOT DETECTED NOT DETECTED Final   Staphylococcus aureus (BCID) NOT DETECTED NOT DETECTED Final   Staphylococcus epidermidis NOT DETECTED NOT DETECTED Final   Staphylococcus lugdunensis NOT DETECTED NOT DETECTED Final   Streptococcus species DETECTED (A) NOT DETECTED Final    Comment: CRITICAL RESULT CALLED TO, READ BACK BY AND VERIFIED WITH: PHARMD V. BRYK 05/24/23 @ 0709 BY AB    Streptococcus agalactiae NOT DETECTED NOT DETECTED Final   Streptococcus pneumoniae DETECTED (A) NOT DETECTED Final    Comment: CRITICAL RESULT CALLED TO, READ BACK BY AND VERIFIED WITH: PHARMD V. BRYK 05/24/23 @ 0709 BY AB    Streptococcus pyogenes NOT DETECTED NOT DETECTED Final   A.calcoaceticus-baumannii NOT DETECTED NOT DETECTED Final   Bacteroides  fragilis NOT DETECTED NOT DETECTED Final   Enterobacterales NOT DETECTED NOT DETECTED Final   Enterobacter cloacae complex NOT DETECTED NOT DETECTED Final   Escherichia coli NOT DETECTED NOT DETECTED Final   Klebsiella aerogenes NOT DETECTED NOT DETECTED Final   Klebsiella oxytoca NOT DETECTED NOT DETECTED Final   Klebsiella pneumoniae NOT DETECTED NOT DETECTED Final   Proteus species NOT DETECTED NOT DETECTED Final   Salmonella species NOT DETECTED NOT DETECTED Final   Serratia marcescens NOT DETECTED NOT DETECTED Final   Haemophilus influenzae NOT DETECTED NOT DETECTED Final   Neisseria meningitidis NOT DETECTED NOT DETECTED Final   Pseudomonas aeruginosa NOT DETECTED NOT DETECTED Final   Stenotrophomonas maltophilia NOT DETECTED NOT DETECTED Final   Candida albicans NOT DETECTED NOT DETECTED Final   Candida auris NOT DETECTED NOT DETECTED Final   Candida glabrata NOT DETECTED NOT DETECTED Final   Candida krusei NOT DETECTED NOT DETECTED Final   Candida parapsilosis NOT DETECTED NOT DETECTED Final   Candida tropicalis NOT DETECTED NOT DETECTED Final   Cryptococcus neoformans/gattii NOT DETECTED NOT DETECTED Final    Comment: Performed at [facility], [address] [place], [place] [address]  Respiratory (~20 pathogens) panel by PCR     Status: None   Collection Time: 05/23/23  4:55 PM   Specimen: Nasopharyngeal Swab; Respiratory  Result Value Ref Range Status   Adenovirus NOT DETECTED NOT DETECTED Final   Coronavirus 229E NOT DETECTED NOT DETECTED Final    Comment: (NOTE) The Coronavirus on the Respiratory Panel, DOES NOT test for the novel  Coronavirus (2019 nCoV)    Coronavirus HKU1 NOT DETECTED NOT DETECTED Final   Coronavirus NL63 NOT DETECTED NOT DETECTED Final   Coronavirus OC43 NOT DETECTED NOT DETECTED Final   Metapneumovirus NOT DETECTED NOT DETECTED Final   Rhinovirus / Enterovirus NOT DETECTED NOT DETECTED Final   Influenza A NOT DETECTED NOT DETECTED Final    Influenza B NOT DETECTED NOT DETECTED Final   Parainfluenza Virus 1 NOT DETECTED NOT DETECTED Final   Parainfluenza Virus 2 NOT DETECTED NOT DETECTED Final   Parainfluenza Virus  3 NOT DETECTED NOT DETECTED Final   Parainfluenza Virus 4 NOT DETECTED NOT DETECTED Final   Respiratory Syncytial Virus NOT DETECTED NOT DETECTED Final   Bordetella pertussis NOT DETECTED NOT DETECTED Final   Bordetella Parapertussis NOT DETECTED NOT DETECTED Final   Chlamydophila pneumoniae NOT DETECTED NOT DETECTED Final   Mycoplasma pneumoniae NOT DETECTED NOT DETECTED Final    Comment: Performed at [facility], [address] [place], [place] [address]  Culture, blood (routine x 2)     Status: None (Preliminary result)   Collection Time: 05/23/23  5:41 PM   Specimen: BLOOD RIGHT ARM  Result Value Ref Range Status   Specimen Description BLOOD RIGHT ARM  Final   Special Requests   Final    BOTTLES DRAWN AEROBIC AND ANAEROBIC Blood Culture results may not be optimal due to an excessive volume of blood received in culture bottles   Culture  Setup Time   Final    GRAM POSITIVE COCCI AEROBIC BOTTLE ONLY CRITICAL VALUE NOTED.  VALUE IS CONSISTENT WITH PREVIOUSLY REPORTED AND CALLED VALUE.    Culture   Final    NO GROWTH < 24 HOURS Performed at [facility], [address] [place], [place] [address]    Report Status PENDING  Incomplete  Expectorated Sputum Assessment w Gram Stain, Rflx to Resp Cult     Status: None   Collection Time: 05/23/23  6:06 PM   Specimen: Expectorated Sputum  Result Value Ref Range Status   Specimen Description EXPECTORATED SPUTUM  Final   Special Requests NONE  Final   Sputum evaluation   Final    THIS SPECIMEN IS ACCEPTABLE FOR SPUTUM CULTURE Performed at [facility], [address] [place], [place] [address]    Report Status 05/23/2023 FINAL  Final  Culture, Respiratory w Gram Stain     Status: None (Preliminary result)   Collection Time: 05/23/23  6:06  PM  Result Value Ref Range Status   Specimen Description EXPECTORATED SPUTUM  Final   Special Requests NONE Reflexed from [id number]  Final   Gram Stain   Final    RARE WBC PRESENT, PREDOMINANTLY PMN MODERATE GRAM POSITIVE COCCI MODERATE GRAM NEGATIVE RODS Performed at [facility], [address] [place], [place] [address]    Culture PENDING  Incomplete   Report Status PENDING  Incomplete  MRSA Next Gen by PCR, Nasal     Status: None   Collection Time: 05/23/23  6:08 PM   Specimen: Nasal Mucosa; Nasal Swab  Result Value Ref Range Status   MRSA by PCR Next Gen NOT DETECTED NOT DETECTED Final    Comment: (NOTE) The GeneXpert MRSA Assay (FDA approved for NASAL specimens only), is one component of a comprehensive MRSA colonization surveillance program. It is not intended to diagnose MRSA infection nor to guide or monitor treatment for MRSA infections. Test performance is not FDA approved in patients less than 19 years old. Performed at [facility], [address] [place], [place] [address]    Pertinent Lab seen by me:    Latest Ref Rng & Units 05/24/2023    1:10 AM 05/23/2023    1:19 PM 07/14/2018    7:43 PM  CBC  WBC 4.0 - 10.5 K/uL 52.2  29.7  18.4   Hemoglobin 13.0 - 17.0 g/dL 78.4  69.6  29.5   Hematocrit 39.0 - 52.0 % 37.8  42.0  47.7   Platelets 150 - 400  K/uL 322  384  365       Latest Ref Rng & Units 05/24/2023    1:10 AM 05/23/2023    1:19 PM 07/14/2018    7:43 PM  CMP  Glucose 70 - 99 mg/dL 161  096  99   BUN 6 - 20 mg/dL 17  18  14    Creatinine 0.61 - 1.24 mg/dL 0.45  4.09  8.11   Sodium 135 - 145 mmol/L 134  135  139   Potassium 3.5 - 5.1 mmol/L 3.9  3.7  4.3   Chloride 98 - 111 mmol/L 108  101  106   CO2 22 - 32 mmol/L 18  17  28    Calcium 8.9 - 10.3 mg/dL 7.7  9.1  8.9   Total Protein 6.5 - 8.1 g/dL 5.9  7.0    Total Bilirubin 0.3 - 1.2 mg/dL 1.7  2.9    Alkaline Phos 38 - 126 U/L 35  45    AST 15 - 41 U/L 42  55    ALT 0 - 44 U/L 35  40        Pertinent Imagings/Other Imagings Plain films and CT images have been personally visualized and interpreted; radiology reports have been reviewed. Decision making incorporated into the Impression / Recommendations.  CT ABDOMEN PELVIS W CONTRAST  Result Date: 05/24/2023 CLINICAL DATA:  Sepsis, low back paraspinal tenderness. EXAM: CT ABDOMEN AND PELVIS WITH CONTRAST TECHNIQUE: Multidetector CT imaging of the abdomen and pelvis was performed using the standard protocol following bolus administration of intravenous contrast. RADIATION DOSE REDUCTION: This exam was performed according to the departmental dose-optimization program which includes automated exposure control, adjustment of the mA and/or kV according to patient size and/or use of iterative reconstruction technique. CONTRAST:  75mL OMNIPAQUE IOHEXOL 350 MG/ML SOLN COMPARISON:  05/23/2023. FINDINGS: Lower chest: Dependent atelectasis is present bilaterally. Hepatobiliary: No focal liver abnormality is seen. There is mild fatty infiltration of the liver. Status post cholecystectomy. No biliary dilatation. Pancreas: Unremarkable. No pancreatic ductal dilatation or surrounding inflammatory changes. Spleen: The spleen is surgically absent. Adrenals/Urinary Tract: The adrenal glands are within normal limits. The kidneys enhance symmetrically. No renal calculus or hydronephrosis. The bladder is unremarkable. Stomach/Bowel: Stomach is within normal limits. Appendix appears normal. No evidence of bowel wall thickening, distention, or inflammatory changes. No free air or pneumatosis. Vascular/Lymphatic: No significant vascular findings are present. No enlarged abdominal or pelvic lymph nodes. Reproductive: Prostate is unremarkable. Other: No abdominopelvic ascites. A fat containing umbilical hernia is present. Musculoskeletal: Degenerative changes are present in the thoracolumbar spine. No acute osseous abnormality. IMPRESSION: 1. No abnormality to explain  reported sepsis. 2. Fatty infiltration of the liver. Electronically Signed   By: Thornell Sartorius M.D.   On: 05/24/2023 00:11   CT CHEST ABDOMEN PELVIS W CONTRAST  Result Date: 05/23/2023 CLINICAL DATA:  Sepsis, abdominal pain EXAM: CT CHEST, ABDOMEN, AND PELVIS WITH CONTRAST TECHNIQUE: Multidetector CT imaging of the chest, abdomen and pelvis was performed following the bolus administration of intravenous contrast. RADIATION DOSE REDUCTION: This exam was performed according to the departmental dose-optimization program which includes automated exposure control, adjustment of the mA and/or kV according to patient size and/or use of iterative reconstruction technique. CONTRAST:  75mL OMNIPAQUE IOHEXOL 350 MG/ML SOLN COMPARISON:  None Available. FINDINGS: Contrast administration is documented by technologist, however there is no evident contrast opacification of the chest, abdomen, or pelvis. CT CHEST FINDINGS Cardiovascular: No significant vascular findings. Normal heart size. No pericardial  effusion. Mediastinum/Nodes: No enlarged mediastinal, hilar, or axillary lymph nodes. Thyroid gland, trachea, and esophagus demonstrate no significant findings. Lungs/Pleura: Lungs are clear. No pleural effusion or pneumothorax. Musculoskeletal: Bilateral gynecomastia.  No acute osseous findings. CT ABDOMEN PELVIS FINDINGS Hepatobiliary: No focal liver abnormality is seen. Hepatomegaly, maximum coronal span 21.0 cm. Hepatic steatosis. Status post cholecystectomy. No biliary dilatation. Pancreas: Unremarkable. No pancreatic ductal dilatation or surrounding inflammatory changes. Spleen: Status post splenectomy. Adrenals/Urinary Tract: Adrenal glands are unremarkable. Kidneys are normal, without renal calculi, solid lesion, or hydronephrosis. Bladder is unremarkable. Stomach/Bowel: Stomach is within normal limits. Appendix appears normal. No evidence of bowel wall thickening, distention, or inflammatory changes. Vascular/Lymphatic:  No significant vascular findings are present. No enlarged abdominal or pelvic lymph nodes. Reproductive: No mass or other abnormality. Other: Small fat containing umbilical hernia.  No ascites. Musculoskeletal: No acute osseous findings. IMPRESSION: 1. Contrast administration is documented by technologist, however there is no evident contrast opacification of the chest, abdomen, or pelvis on examination is submitted for review. This could be due to contrast extravasation or other technical failure. Please see technologist documentation further information. 2. Within this limitation, no acute noncontrast CT findings of the chest, abdomen, or pelvis to explain sepsis. 3. Hepatomegaly and hepatic steatosis. 4. Status post cholecystectomy and splenectomy. Electronically Signed   By: Jearld Lesch M.D.   On: 05/23/2023 15:28   DG Chest Port 1 View  Result Date: 05/23/2023 CLINICAL DATA:  Weakness. EXAM: PORTABLE CHEST 1 VIEW COMPARISON:  February 16, 2016. FINDINGS: Mild cardiomegaly is noted. Hypoinflation of the lungs is noted with minimal bibasilar subsegmental atelectasis. Bony thorax is unremarkable. IMPRESSION: Hypoinflation of the lungs with minimal bibasilar subsegmental atelectasis. Electronically Signed   By: Lupita Raider M.D.   On: 05/23/2023 14:23    I have personally spent 87 minutes involved in face-to-face and non-face-to-face activities for this patient on the day of the visit. Professional time spent includes the following activities: Preparing to see the patient (review of tests), Obtaining and/or reviewing separately obtained history (admission/discharge record), Performing a medically appropriate examination and/or evaluation , Ordering medications/tests/procedures, referring and communicating with other health care professionals, Documenting clinical information in the EMR, Independently interpreting results (not separately reported), Communicating results to the patient/family/caregiver,  Counseling and educating the patient/family/caregiver and Care coordination (not separately reported).  Electronically signed by:   Plan d/w requesting provider   Note: This document was prepared using dragon voice recognition software and may include unintentional dictation errors.   Odette Fraction, MD Infectious Disease Physician [facility] Pager: [phone]

## 2023-05-25 ENCOUNTER — Inpatient Hospital Stay (HOSPITAL_COMMUNITY): Payer: Self-pay

## 2023-05-25 DIAGNOSIS — R748 Abnormal levels of other serum enzymes: Secondary | ICD-10-CM

## 2023-05-25 DIAGNOSIS — R7881 Bacteremia: Secondary | ICD-10-CM

## 2023-05-25 DIAGNOSIS — E871 Hypo-osmolality and hyponatremia: Secondary | ICD-10-CM

## 2023-05-25 DIAGNOSIS — Z9081 Acquired absence of spleen: Secondary | ICD-10-CM

## 2023-05-25 DIAGNOSIS — E669 Obesity, unspecified: Secondary | ICD-10-CM

## 2023-05-25 DIAGNOSIS — E66812 Obesity, class 2: Secondary | ICD-10-CM

## 2023-05-25 LAB — ECHOCARDIOGRAM COMPLETE
AR max vel: 4.68 cm2
AV Peak grad: 5.2 mmHg
Ao pk vel: 1.14 m/s
Area-P 1/2: 3.42 cm2
Height: 76 in
S' Lateral: 3.8 cm
Weight: 4716.08 oz

## 2023-05-25 LAB — CULTURE, BLOOD (ROUTINE X 2)

## 2023-05-25 MED ORDER — HYDROMORPHONE HCL 1 MG/ML IJ SOLN
0.5000 mg | INTRAMUSCULAR | Status: DC | PRN
  Administered 2023-05-25 – 2023-05-28 (×4): 0.5 mg via INTRAVENOUS
  Filled 2023-05-25 (×4): qty 0.5

## 2023-05-25 MED ORDER — MAGNESIUM SULFATE 4 GM/100ML IV SOLN
4.0000 g | Freq: Once | INTRAVENOUS | Status: AC
  Administered 2023-05-25: 4 g via INTRAVENOUS
  Filled 2023-05-25: qty 100

## 2023-05-25 MED ORDER — IBUPROFEN 200 MG PO TABS: 400.0000 mg | ORAL_TABLET | Freq: Four times a day (QID) | ORAL | Status: DC | PRN

## 2023-05-25 MED ORDER — HYDROMORPHONE HCL 1 MG/ML IJ SOLN
0.5000 mg | INTRAMUSCULAR | Status: DC | PRN
  Administered 2023-05-25: 0.5 mg via INTRAVENOUS
  Filled 2023-05-25: qty 0.5

## 2023-05-25 MED ORDER — PANTOPRAZOLE SODIUM 40 MG PO TBEC
40.0000 mg | DELAYED_RELEASE_TABLET | Freq: Every day | ORAL | Status: DC
  Administered 2023-05-25 – 2023-05-29 (×5): 40 mg via ORAL
  Filled 2023-05-25 (×5): qty 1

## 2023-05-25 MED ORDER — ENOXAPARIN SODIUM 40 MG/0.4ML IJ SOSY
40.0000 mg | PREFILLED_SYRINGE | INTRAMUSCULAR | Status: DC
  Administered 2023-05-25: 40 mg via SUBCUTANEOUS
  Filled 2023-05-25 (×3): qty 0.4

## 2023-05-25 NOTE — Assessment & Plan Note (Addendum)
Hypomagnesemia.   Electrolytes have been corrected with Na at 139, K 4,3 and serum bicarbonate at 25. Cr is 1,0 and BUN 8

## 2023-05-25 NOTE — Progress Notes (Signed)
Echocardiogram 2D Echocardiogram has been performed.  Frank Bell 05/25/2023, 11:59 AM

## 2023-05-25 NOTE — Hospital Course (Addendum)
Frank Bell was admitted to the hospital with the working diagnosis of sepsis with streptococcus pneumonia bacteremia.   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 Diffuse abdominal pain radiated to the back, on his initial physical examination his temp was 103.1, he had hypotension,  HR 62, RR 14, dry mucous membranes, lungs with no wheezing or rales, heart with S1 and S2 present and rhythmic, abdomen with no distention or tenderness, no lower extremity edema.  Na 135, K 3,7 Cl 101, bicarbonate 17, glucose 106, bun 18, cr 1,15  Total bilirubin 2,9, AST 55, ALT 40 Lactic acid 2,8  Wbc 29,7, hgb 15.1 plt 384   Urine analysis SG 1,023, negative protein, negative hgb and negative leukocytes.  Strep Pneumo urinary antigen positive.  #2/2 Blood cultures positive for streptococcal pneumonia.   Chest radiograph with right rotation with no infiltrates or effusions. CT chest with negative for infiltrates.  CT abdomen and pelvis no acute changes. SP cholecystectomy and splenectomy. Fatty infiltration of the liver.   EKG 102 bpm, left axis deviation, normal intervals, sinus rhythm with no significant ST segment or T wave changes.   Patient had IV fluids and then started on norepinephrine for vasopressor support.   06/23 weaned off norepinephrine.  06/24 transferred to [facility]  06/25 orthopantogram with right upper premolar erosive changes. Transthoracic echocardiogram with no vegetations.  Follow up blood cultures with no growth.  Patient will need TEE, scheduled for later this week per cardiology.  06/26 MRI lumbar spine due to back pain with no signs of infection.  06/27 TEE with no vegetations.  06/28 patient transitioned to oral amoxicillin to complete 2 weeks from the blood cultures. Date to stop on 06/07/23.  Follow up as outpatient.

## 2023-05-25 NOTE — Assessment & Plan Note (Addendum)
Patient was noted to have elevated liver enzymes, which were suspected to be related to ceftriaxone IV.  Antibiotic was changed to penicillin with now improvement in liver enzymes with AST down to  126,and  ALT 210, total bilirubin 0,7.   Plan to follow up hepatic function as outpatient.

## 2023-05-25 NOTE — Progress Notes (Signed)
Carollee Herter DO in formed tha  about adding Robaxin PCCM is still the attending so t/c to elink about adding Robaxin

## 2023-05-25 NOTE — Plan of Care (Signed)

## 2023-05-25 NOTE — Progress Notes (Signed)
  Progress Note   Patient: Frank Bell WUJ:[id number] DOB: 28-Apr-1991 DOA: 05/23/2023     2 DOS: the patient was seen and examined on 05/25/2023   Brief hospital course: Frank Bell was admitted to the hospital with the working diagnosis of sepsis with streptococcus pneumonia bacteremia.   32 yo male with past medical history of alcohol abuse, vaping and hereditary spherocytosis and recurrent episodes of sepsis who presented with abdominal pain, nausea and vomiting for one  day. Diffuse abdominal pain radiated to the back, on his initial physical examination his temp was 103.1, hypotension,  HR 62, RR 14, dry mucous membranes, lungs with no wheezing or rales, heart with S1 and S2 present and rhythmic, abdomen with no distention or tenderness, no lower extremity edema.  Blood cultures positive for streptococcal pneumonia x2   Patient had IV fluids and then started on norepinephrine for vasopressor support.   06/23 weaned off norepinephrine.  06/24 transferred to [facility].   Assessment and Plan: * Bacteremia Streptococcal pneumonia bacteremia. Complicated with septic shock, now resolved.   Follow up blood cultures with no growth. Wbc continue to be high at 52.2   Plan to continue IV ceftriaxone.  Follow up on panoramic radiograph teeth. Follow up on echocardiogram. Follow up cell count and temperature curve.   S/P splenectomy Positive immunosuppression.   Plan to continue antibiotic therapy.   Elevated liver enzymes Clinically improving with AST 42 and ALT 35.  Total bilirubin 1,7.  Ct abdomen and pelvis with no acute changes. Positive fatty liver.   Class 2 obesity Calculated BMI is 35,8         Subjective: Patient is feeling better, no chest pain or dyspnea, no nausea or vomiting. Positive back pain, acute on chronic.   Physical Exam: Vitals:   05/24/23 2331 05/25/23 0456 05/25/23 0500 05/25/23 0813  BP:  118/77  113/83  Pulse:  (!) 51  (!) 58  Resp:  18  17   Temp: 97.9 F (36.6 C) 97.9 F (36.6 C)  97.8 F (36.6 C)  TempSrc:    Oral  SpO2:  95%  98%  Weight:   133.7 kg   Height:       Neurology awake and alert ENT with no pallor Cardiovascular with S1 and S2 present and rhythmic with no gallops. Respiratory with no rales or wheezing, no rhonchi Abdomen with no distention  No lower extremity edema No rashes  Data Reviewed:    Family Communication: no family at the bedside   Disposition: Status is: Inpatient Remains inpatient appropriate because: IV antibiotic therapy   Planned Discharge Destination: Home    Author: Coralie Keens, MD 05/25/2023 1:53 PM  For on call review [email or web address].

## 2023-05-25 NOTE — Assessment & Plan Note (Signed)
Positive immunosuppression.   Plan to continue antibiotic therapy.

## 2023-05-25 NOTE — Assessment & Plan Note (Signed)
Calculated BMI is 35,8  °

## 2023-05-25 NOTE — Plan of Care (Signed)
Patient alert/oriented X4. Patient compliant with medication administration and tolerated IV abx + IV magnesium. Patient ambulated independently around room and complained of no pain. VSS, no complaints at this time.  Problem: Education: Goal: Knowledge of General Education information will improve Description: Including pain rating scale, medication(s)/side effects and non-pharmacologic comfort measures Outcome: Progressing   Problem: Health Behavior/Discharge Planning: Goal: Ability to manage health-related needs will improve Outcome: Progressing   Problem: Clinical Measurements: Goal: Ability to maintain clinical measurements within normal limits will improve Outcome: Progressing   Problem: Clinical Measurements: Goal: Will remain free from infection Outcome: Progressing   Problem: Clinical Measurements: Goal: Diagnostic test results will improve Outcome: Progressing   Problem: Clinical Measurements: Goal: Respiratory complications will improve Outcome: Progressing   Problem: Clinical Measurements: Goal: Cardiovascular complication will be avoided Outcome: Progressing   Problem: Activity: Goal: Risk for activity intolerance will decrease Outcome: Progressing   Problem: Nutrition: Goal: Adequate nutrition will be maintained Outcome: Progressing   Problem: Coping: Goal: Level of anxiety will decrease Outcome: Progressing   Problem: Elimination: Goal: Will not experience complications related to bowel motility Outcome: Progressing   Problem: Elimination: Goal: Will not experience complications related to urinary retention Outcome: Progressing   Problem: Pain Managment: Goal: General experience of comfort will improve Outcome: Progressing   Problem: Safety: Goal: Ability to remain free from injury will improve Outcome: Progressing   Problem: Skin Integrity: Goal: Risk for impaired skin integrity will decrease Outcome: Progressing

## 2023-05-25 NOTE — Assessment & Plan Note (Addendum)
Streptococcal pneumonia bacteremia. Complicated with septic shock, now resolved.   Follow up blood cultures with no growth. He has been afebrile, and discharge wbc is 14,3  Panoramic radiograph with right 4th pre molar erosion. (Possible source of bacteremia).   Echocardiogram (transthoracic) with no vegetations.  Lumbar spine MRI with no signs of spine infection, TEE with no vegetations.   Antibiotic therapy changed to Penicillin due to elevated liver enzymes.  At the time of his discharge he will continue antibiotic therapy until 07/08 with oral amoxicillin.

## 2023-05-25 NOTE — Progress Notes (Signed)
[facility] Carollee Herter DO inquired of Robaxin would benefit patient

## 2023-05-26 DIAGNOSIS — E871 Hypo-osmolality and hyponatremia: Secondary | ICD-10-CM

## 2023-05-26 LAB — COMPREHENSIVE METABOLIC PANEL
ALT: 54 U/L — ABNORMAL HIGH (ref 0–44)
AST: 45 U/L — ABNORMAL HIGH (ref 15–41)
Albumin: 2.9 g/dL — ABNORMAL LOW (ref 3.5–5.0)
Alkaline Phosphatase: 52 U/L (ref 38–126)
Anion gap: 10 (ref 5–15)
BUN: 6 mg/dL (ref 6–20)
CO2: 24 mmol/L (ref 22–32)
Calcium: 8.6 mg/dL — ABNORMAL LOW (ref 8.9–10.3)
Chloride: 104 mmol/L (ref 98–111)
Creatinine, Ser: 0.91 mg/dL (ref 0.61–1.24)
GFR, Estimated: >60 mL/min (ref >60)
Glucose, Bld: 79 mg/dL (ref 70–99)
Potassium: 3.9 mmol/L (ref 3.5–5.1)
Sodium: 138 mmol/L (ref 135–145)
Total Bilirubin: 1 mg/dL (ref 0.3–1.2)
Total Protein: 6.1 g/dL — ABNORMAL LOW (ref 6.5–8.1)

## 2023-05-26 LAB — CBC WITH DIFFERENTIAL/PLATELET
Abs Immature Granulocytes: 0.1 10*3/uL — ABNORMAL HIGH (ref 0.00–0.07)
Basophils Absolute: 0.1 10*3/uL (ref 0.0–0.1)
Basophils Relative: 1 %
Eosinophils Absolute: 0.3 10*3/uL (ref 0.0–0.5)
Eosinophils Relative: 2 %
HCT: 41.6 % (ref 39.0–52.0)
Hemoglobin: 14.4 g/dL (ref 13.0–17.0)
Immature Granulocytes: 1 %
Lymphocytes Relative: 21 %
Lymphs Abs: 3.1 10*3/uL (ref 0.7–4.0)
MCH: 32.6 pg (ref 26.0–34.0)
MCHC: 34.6 g/dL (ref 30.0–36.0)
MCV: 94.1 fL (ref 80.0–100.0)
Monocytes Absolute: 1.8 10*3/uL — ABNORMAL HIGH (ref 0.1–1.0)
Monocytes Relative: 12 %
Neutro Abs: 9.9 10*3/uL — ABNORMAL HIGH (ref 1.7–7.7)
Neutrophils Relative %: 63 %
Platelets: 350 10*3/uL (ref 150–400)
RBC: 4.42 MIL/uL (ref 4.22–5.81)
RDW: 13.5 % (ref 11.5–15.5)
WBC: 15.3 10*3/uL — ABNORMAL HIGH (ref 4.0–10.5)
nRBC: 0.1 % (ref 0.0–0.2)

## 2023-05-26 LAB — CULTURE, BLOOD (ROUTINE X 2): Special Requests: ADEQUATE

## 2023-05-26 LAB — CULTURE, RESPIRATORY W GRAM STAIN: Culture: NORMAL

## 2023-05-26 NOTE — TOC CM/SW Note (Signed)
Transition of Care [facility]) - Inpatient Brief Assessment   Patient Details  Name: Frank Bell MRN: [id number] Date of Birth: 1991-03-05  Transition of Care [facility]) CM/SW Contact:    Darrold Span, RN Phone Number: 05/26/2023, 1:22 PM Cross coverage for [phone]  Clinical Narrative: Tx from 55M, noted bacteremia- ID following, may need long-term IV abx??- no insurance noted on file. TOC will continue to monitor patient advancement through interdisciplinary progression rounds. If new patient transition needs arise, please place a TOC consult.   Transition of Care Asessment: Insurance and Status:  (no insurance listed) Patient has primary care physician: Yes Home environment has been reviewed: home (wife) Prior level of function:: independent Prior/Current Home Services: No current home services Social Determinants of Health Reivew: SDOH reviewed no interventions necessary Readmission risk has been reviewed: Yes Transition of care needs: transition of care needs identified, TOC will continue to follow

## 2023-05-26 NOTE — Plan of Care (Signed)

## 2023-05-26 NOTE — Progress Notes (Signed)
Progress Note   Patient: Frank Bell WUJ:[id number] DOB: 1991-10-10 DOA: 05/23/2023     3 DOS: the patient was seen and examined on 05/26/2023   Brief hospital course: Mr. Mainwaring was admitted to the hospital with the working diagnosis of sepsis with streptococcus pneumonia bacteremia.   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 Diffuse abdominal pain radiated to the back, on his initial physical examination his temp was 103.1, he had hypotension,  HR 62, RR 14, dry mucous membranes, lungs with no wheezing or rales, heart with S1 and S2 present and rhythmic, abdomen with no distention or tenderness, no lower extremity edema.  Na 135, K 3,7 Cl 101, bicarbonate 17, glucose 106, bun 18, cr 1,15  Total bilirubin 2,9, AST 55, ALT 40 Lactic acid 2,8  Wbc 29,7, hgb 15.1 plt 384   Urine analysis SG 1,023, negative protein, negative hgb and negative leukocytes.  Strep Pneumo urinary antigen positive.  #2/2 Blood cultures positive for streptococcal pneumonia x2   Chest radiograph with right rotation with no infiltrates or effusions. CT chest with negative for infiltrates.  CT abdomen and pelvis no acute changes. SP cholecystectomy and splenectomy. Fatty infiltration of the liver.   EKG 102 bpm, left axis deviation, normal intervals, sinus rhythm with no significant ST segment or T wave changes.   Patient had IV fluids and then started on norepinephrine for vasopressor support.   06/23 weaned off norepinephrine.  06/24 transferred to [facility].  06/25 orthopantogram with right upper premolar erosive changes. Transthoracic echocardiogram with no vegetations.  Follow up blood cultures with no growth.  Patient may need TEE.   Assessment and Plan: * Bacteremia Streptococcal pneumonia bacteremia. Complicated with septic shock, now resolved.    Follow up blood cultures with no growth. Wbc trending down, today at 15,3   Panoramic radiograph with right 4th pre molar erosion.  Echocardiogram (transthoracic) with no vegetations.  Patient may need TEE, will follow up with ID recommendations.   S/P splenectomy Positive immunosuppression.   Plan to continue antibiotic therapy.   Elevated liver enzymes Total bilirubin is down to 1,0 AST is 45 and ALT 54,   Ct abdomen and pelvis with no acute changes. Positive fatty liver.   Continue to monitor liver enzymes.   Hyponatremia Hypomagnesemia.   Renal function stable with serum cr at 0.91 with K at 3,9 and serum bicarbonate at 24. Na 138.   Plan to continue supportive medical care.  Follow up renal function and electrolytes.   Class 2 obesity Calculated BMI is 35,8         Subjective: Patient with no chest pain or dyspnea, no abdominal pain   Physical Exam: Vitals:   05/26/23 0036 05/26/23 0558 05/26/23 0648 05/26/23 0743  BP: 108/61 110/81  122/77  Pulse: 72 80  (!) 51  Resp: 18 18    Temp: 98.1 F (36.7 C) 97.6 F (36.4 C)  97.9 F (36.6 C)  TempSrc:      SpO2: 96% 97%  96%  Weight:   128.4 kg   Height:       Neurology awake and alert ENT with mild pallor Cardiovascular with S1 and S2 present and rhythmic with no gallops, rubs or murmurs Respiratory with no rales or wheezing Abdomen with no distention and non tender No lower extremity edema  Data Reviewed:  Family Communication: no family at the bedside   Disposition: Status is: Inpatient Remains inpatient appropriate because: pending workup   Planned Discharge Destination: Home    Author: Coralie Keens, MD 05/26/2023 1:06 PM  For on call review [email or web address].

## 2023-05-26 NOTE — Progress Notes (Addendum)
[facility]  Date of Admission:  05/23/2023   Total days of inpatient antibiotics 3  Principal Problem:   Bacteremia Active Problems:   S/P splenectomy   Elevated liver enzymes   Class 2 obesity   Hyponatremia          Assessment: 32 year old male with history of asthma, alcohol abuse, vaping, history spherocytosis s/p splenectomy with recurrent episodes of sepsis admitted with: #Septic shock secondary to strep pneumo bacteremia patient presented with nausea, vomiting, diffuse abdominal pain fevers and chills with generalized aches and pains.  Found to have WBC of 52 K, Tmax 103.1.  Blood cultures from admission grew strep pneumoniae -CT chest abdomen pelvis with contrast hepatomegaly and hepatic steatosis, cholecystectomy and splenectomy - Workup with CT abdomen pelvis showed fatty infiltration of the liver.  - Reported some tooth pain as-orthopantogram ordered which showed marked erosive changes within the right upper premolar - Distance history of snorting cocaine. - Respiratory cultures on 6/22 showed normal respiratory flora, RVP negative. -TTE negative for vegetation Recommendations: -Continue ceftriaxone - Will get TEE for further characterization - MRI lumbar spine given gram-positive bacteremia and back pain. - I recommended that patient see dentistry outpatient given this is likely source of bacteremia. -Follow-up blood cultures to ensure clearance  Microbiology:   Antibiotics: Vancomycin 6/22 - 6/23 Ceftriaxone 6/23-present 6/22 cefepime Metronidazole 6/22 Cultures: Blood 6/20 to 2/2 strep pneumoniae 6/24 no growth    SUBJECTIVE: Staying in bed.  No new complaints. Interval: Afebrile overnight, WBC 15.3 K  Review of Systems: Review of Systems  All other systems reviewed and are negative.    Scheduled Meds:  Chlorhexidine Gluconate Cloth  6 each Topical Daily   enoxaparin (LOVENOX) injection  40 mg Subcutaneous  Q24H   lidocaine  1 patch Transdermal Q24H   nicotine  21 mg Transdermal Daily   pantoprazole  40 mg Oral Daily   Continuous Infusions:  sodium chloride Stopped (05/24/23 1816)   cefTRIAXone (ROCEPHIN)  IV 2 g (05/25/23 1431)   PRN Meds:.acetaminophen, docusate sodium, HYDROmorphone (DILAUDID) injection, ibuprofen, polyethylene glycol No Known Allergies  OBJECTIVE: Vitals:   05/26/23 0036 05/26/23 0558 05/26/23 0648 05/26/23 0743  BP: 108/61 110/81  122/77  Pulse: 72 80  (!) 51  Resp: 18 18    Temp: 98.1 F (36.7 C) 97.6 F (36.4 C)  97.9 F (36.6 C)  TempSrc:      SpO2: 96% 97%  96%  Weight:   128.4 kg   Height:       Body mass index is 34.46 kg/m.  Physical Exam Constitutional:      General: He is not in acute distress.    Appearance: He is normal weight. He is not toxic-appearing.  HENT:     Head: Normocephalic and atraumatic.     Right Ear: External ear normal.     Left Ear: External ear normal.     Nose: No congestion or rhinorrhea.     Mouth/Throat:     Mouth: Mucous membranes are moist.     Pharynx: Oropharynx is clear.  Eyes:     Extraocular Movements: Extraocular movements intact.     Conjunctiva/sclera: Conjunctivae normal.     Pupils: Pupils are equal, round, and reactive to light.  Cardiovascular:     Rate and Rhythm: Normal rate and regular rhythm.     Heart sounds: No murmur heard.    No friction rub. No gallop.  Pulmonary:  Effort: Pulmonary effort is normal.     Breath sounds: Normal breath sounds.  Abdominal:     General: Abdomen is flat. Bowel sounds are normal.     Palpations: Abdomen is soft.  Musculoskeletal:        General: No swelling. Normal range of motion.     Cervical back: Normal range of motion and neck supple.  Skin:    General: Skin is warm and dry.  Neurological:     General: No focal deficit present.     Mental Status: He is oriented to person, place, and time.  Psychiatric:        Mood and Affect: Mood normal.        Lab Results Lab Results  Component Value Date   WBC 15.3 (H) 05/26/2023   HGB 14.4 05/26/2023   HCT 41.6 05/26/2023   MCV 94.1 05/26/2023   PLT 350 05/26/2023    Lab Results  Component Value Date   CREATININE 0.91 05/26/2023   BUN 6 05/26/2023   NA 138 05/26/2023   K 3.9 05/26/2023   CL 104 05/26/2023   CO2 24 05/26/2023    Lab Results  Component Value Date   ALT 54 (H) 05/26/2023   AST 45 (H) 05/26/2023   ALKPHOS 52 05/26/2023   BILITOT 1.0 05/26/2023        Danelle Earthly, MD [facility] 05/26/2023, 1:33 PM   I have personally spent 55 resting in bed.  No new complaints.  Minutes involved in face-to-face and non-face-to-face activities for this patient on the day of the visit. Professional time spent includes the following activities: Preparing to see the patient (review of tests), Obtaining and/or reviewing separately obtained history (admission/discharge record), Performing a medically appropriate examination and/or evaluation , Ordering medications/tests/procedures, referring and communicating with other health care professionals, Documenting clinical information in the EMR, Independently interpreting results (not separately reported), Communicating results to the patient/family/caregiver, Counseling and educating the patient/family/caregiver and Care coordination (not separately reported).

## 2023-05-27 ENCOUNTER — Inpatient Hospital Stay (HOSPITAL_COMMUNITY): Payer: Self-pay

## 2023-05-27 LAB — CBC WITH DIFFERENTIAL/PLATELET
Abs Immature Granulocytes: 0.25 10*3/uL — ABNORMAL HIGH (ref 0.00–0.07)
Basophils Absolute: 0.2 10*3/uL — ABNORMAL HIGH (ref 0.0–0.1)
Basophils Relative: 2 %
Eosinophils Absolute: 0.5 10*3/uL (ref 0.0–0.5)
Eosinophils Relative: 4 %
HCT: 40.8 % (ref 39.0–52.0)
Hemoglobin: 14.4 g/dL (ref 13.0–17.0)
Immature Granulocytes: 2 %
Lymphocytes Relative: 30 %
Lymphs Abs: 3.7 10*3/uL (ref 0.7–4.0)
MCH: 32.8 pg (ref 26.0–34.0)
MCHC: 35.3 g/dL (ref 30.0–36.0)
MCV: 92.9 fL (ref 80.0–100.0)
Monocytes Absolute: 1.2 10*3/uL — ABNORMAL HIGH (ref 0.1–1.0)
Monocytes Relative: 10 %
Neutro Abs: 6.5 10*3/uL (ref 1.7–7.7)
Neutrophils Relative %: 52 %
Platelets: 418 10*3/uL — ABNORMAL HIGH (ref 150–400)
RBC: 4.39 MIL/uL (ref 4.22–5.81)
RDW: 13.3 % (ref 11.5–15.5)
WBC: 12.3 10*3/uL — ABNORMAL HIGH (ref 4.0–10.5)
nRBC: 0 % (ref 0.0–0.2)

## 2023-05-27 LAB — COMPREHENSIVE METABOLIC PANEL
ALT: 150 U/L — ABNORMAL HIGH (ref 0–44)
AST: 176 U/L — ABNORMAL HIGH (ref 15–41)
Albumin: 2.9 g/dL — ABNORMAL LOW (ref 3.5–5.0)
Alkaline Phosphatase: 45 U/L (ref 38–126)
Anion gap: 9 (ref 5–15)
BUN: 10 mg/dL (ref 6–20)
CO2: 24 mmol/L (ref 22–32)
Calcium: 8.4 mg/dL — ABNORMAL LOW (ref 8.9–10.3)
Chloride: 103 mmol/L (ref 98–111)
Creatinine, Ser: 1.01 mg/dL (ref 0.61–1.24)
GFR, Estimated: >60 mL/min (ref >60)
Glucose, Bld: 85 mg/dL (ref 70–99)
Potassium: 3.7 mmol/L (ref 3.5–5.1)
Sodium: 136 mmol/L (ref 135–145)
Total Bilirubin: 0.9 mg/dL (ref 0.3–1.2)
Total Protein: 6.1 g/dL — ABNORMAL LOW (ref 6.5–8.1)

## 2023-05-27 LAB — CULTURE, BLOOD (ROUTINE X 2): Culture: NO GROWTH

## 2023-05-27 MED ORDER — GADOBUTROL 1 MMOL/ML IV SOLN
10.0000 mL | Freq: Once | INTRAVENOUS | Status: AC | PRN
  Administered 2023-05-27: 10 mL via INTRAVENOUS

## 2023-05-27 MED ORDER — PENICILLIN G POTASSIUM 20000000 UNITS IJ SOLR
12.0000 10*6.[IU] | Freq: Two times a day (BID) | INTRAVENOUS | Status: DC
  Administered 2023-05-27 – 2023-05-29 (×4): 12 10*6.[IU] via INTRAVENOUS
  Filled 2023-05-27 (×5): qty 12

## 2023-05-27 NOTE — Progress Notes (Signed)
Progress Note   Patient: Frank Bell ZOX:[id number] DOB: [birth year] DOA: 05/23/2023     4 DOS: the patient was seen and examined on 05/27/2023   Brief hospital course: Frank Bell was admitted to the hospital with the working diagnosis of sepsis with streptococcus pneumonia bacteremia.   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 Diffuse abdominal pain radiated to the back, on his initial physical examination his temp was 103.1, he had hypotension,  HR 62, RR 14, dry mucous membranes, lungs with no wheezing or rales, heart with S1 and S2 present and rhythmic, abdomen with no distention or tenderness, no lower extremity edema.  Na 135, K 3,7 Cl 101, bicarbonate 17, glucose 106, bun 18, cr 1,15  Total bilirubin 2,9, AST 55, ALT 40 Lactic acid 2,8  Wbc 29,7, hgb 15.1 plt 384   Urine analysis SG 1,023, negative protein, negative hgb and negative leukocytes.  Strep Pneumo urinary antigen positive.  #2/2 Blood cultures positive for streptococcal pneumonia x2   Chest radiograph with right rotation with no infiltrates or effusions. CT chest with negative for infiltrates.  CT abdomen and pelvis no acute changes. SP cholecystectomy and splenectomy. Fatty infiltration of the liver.   EKG 102 bpm, left axis deviation, normal intervals, sinus rhythm with no significant ST segment or T wave changes.   Patient had IV fluids and then started on norepinephrine for vasopressor support.   06/23 weaned off norepinephrine.  06/24 transferred to [facility].  06/25 orthopantogram with right upper premolar erosive changes. Transthoracic echocardiogram with no vegetations.  Follow up blood cultures with no growth.  Patient will need TEE, scheduled for later this week per cardiology.  06/26 MRI lumbar spine due to back pain.   Assessment and Plan: *  Bacteremia Streptococcal pneumonia bacteremia. Complicated with septic shock, now resolved.   Follow up blood cultures with no growth. Wbc trending down, today at 12,3   Panoramic radiograph with right 4th pre molar erosion.  Echocardiogram (transthoracic) with no vegetations.  Plan for TEE and lumbar spine MRI for further work up.  Continue antibiotic therapy with ceftriaxone IV.   S/P splenectomy Positive immunosuppression.   Plan to continue antibiotic therapy.   Elevated liver enzymes Worsening elevation of liver enzymes with AST 176 and ALT 150. Total bilirubin has normalized.   Last dose of acetaminophen was 06/23 and he has not required ibuprofen.  CT with fatty liver.  Continue close monitoring, will check with pharmacy if any current medication can be culprit.   Hyponatremia Hypomagnesemia.   Renal function continue to be stable, electrolytes have been corrected.  Patient is tolerating po well.   Class 2 obesity Calculated BMI is 35,8         Subjective: Patient with no chest pain or dyspnea, back pain has been stable.   Physical Exam: Vitals:   05/26/23 1543 05/26/23 2035 05/27/23 0455 05/27/23 0750  BP: 115/75 123/76 118/81 127/77  Pulse: (!) 52 (!) 59 (!) 51 (!) 56  Resp:  18 18 17   Temp:  97.9 F (36.6 C) 97.7 F (36.5 C) 98.3 F (36.8 C)  TempSrc:    Oral  SpO2: 98% 98% 97% 97%  Weight:      Height:       Neurology awake and alert ENT with no pallor or icterus Cardiovascular with S1 and S2 present and rhythmic  Respiratory with no rales or wheezing Abdomen with no distention  No lower extremity edema  Data Reviewed:    Family Communication: no family at the bedside   Disposition: Status is: Inpatient Remains inpatient appropriate because: IV antibiotics, pending TEE and spine MRI  Planned Discharge Destination: Home    Author: Coralie Keens, MD 05/27/2023 9:37 AM  For on call review [email or web address].

## 2023-05-27 NOTE — H&P (View-Only) (Signed)
    [facility] has been requested to perform a transesophageal echocardiogram on Frank Bell for evaluation of possible endocarditis with blood cultures positive for streptococcus pneumoniae.  After careful review of history and examination, the risks and benefits of transesophageal echocardiogram have been explained including risks of esophageal damage, perforation (1:10,000 risk), bleeding, pharyngeal hematoma as well as other potential complications associated with conscious sedation including aspiration, arrhythmia, respiratory failure and death. Alternatives to treatment were discussed, questions were answered. Patient is willing to proceed.   Krayton Wortley PA-C 05/27/2023 3:36 PM   

## 2023-05-27 NOTE — Progress Notes (Signed)
    [facility] has been requested to perform a transesophageal echocardiogram on Frank Bell for evaluation of possible endocarditis with blood cultures positive for streptococcus pneumoniae.  After careful review of history and examination, the risks and benefits of transesophageal echocardiogram have been explained including risks of esophageal damage, perforation (1:10,000 risk), bleeding, pharyngeal hematoma as well as other potential complications associated with conscious sedation including aspiration, arrhythmia, respiratory failure and death. Alternatives to treatment were discussed, questions were answered. Patient is willing to proceed.   Perlie Gold PA-C 05/27/2023 3:36 PM

## 2023-05-28 ENCOUNTER — Inpatient Hospital Stay (HOSPITAL_COMMUNITY): Payer: Self-pay

## 2023-05-28 ENCOUNTER — Inpatient Hospital Stay (HOSPITAL_COMMUNITY): Payer: Self-pay | Admitting: Anesthesiology

## 2023-05-28 ENCOUNTER — Encounter (HOSPITAL_COMMUNITY)
Admission: EM | Disposition: A | Discharge status: Home / Self Care | Payer: Self-pay | Source: Home / Self Care | Attending: Internal Medicine

## 2023-05-28 ENCOUNTER — Encounter (HOSPITAL_COMMUNITY): Payer: Self-pay | Admitting: Internal Medicine

## 2023-05-28 DIAGNOSIS — R7881 Bacteremia: Secondary | ICD-10-CM

## 2023-05-28 DIAGNOSIS — I340 Nonrheumatic mitral (valve) insufficiency: Secondary | ICD-10-CM

## 2023-05-28 DIAGNOSIS — F17210 Nicotine dependence, cigarettes, uncomplicated: Secondary | ICD-10-CM

## 2023-05-28 DIAGNOSIS — F909 Attention-deficit hyperactivity disorder, unspecified type: Secondary | ICD-10-CM

## 2023-05-28 DIAGNOSIS — J45909 Unspecified asthma, uncomplicated: Secondary | ICD-10-CM

## 2023-05-28 HISTORY — PX: TEE WITHOUT CARDIOVERSION: SHX5443

## 2023-05-28 LAB — CBC
HCT: 43.8 % (ref 39.0–52.0)
Hemoglobin: 15.3 g/dL (ref 13.0–17.0)
MCH: 32.6 pg (ref 26.0–34.0)
MCHC: 34.9 g/dL (ref 30.0–36.0)
MCV: 93.4 fL (ref 80.0–100.0)
Platelets: 472 K/uL — ABNORMAL HIGH (ref 150–400)
RBC: 4.69 MIL/uL (ref 4.22–5.81)
RDW: 13.2 % (ref 11.5–15.5)
WBC: 14.1 K/uL — ABNORMAL HIGH (ref 4.0–10.5)
nRBC: 0 % (ref 0.0–0.2)

## 2023-05-28 LAB — COMPREHENSIVE METABOLIC PANEL
ALT: 232 U/L — ABNORMAL HIGH (ref 0–44)
AST: 194 U/L — ABNORMAL HIGH (ref 15–41)
Albumin: 3 g/dL — ABNORMAL LOW (ref 3.5–5.0)
Alkaline Phosphatase: 44 U/L (ref 38–126)
Anion gap: 7 (ref 5–15)
BUN: 9 mg/dL (ref 6–20)
CO2: 26 mmol/L (ref 22–32)
Calcium: 8.6 mg/dL — ABNORMAL LOW (ref 8.9–10.3)
Chloride: 105 mmol/L (ref 98–111)
Creatinine, Ser: 0.95 mg/dL (ref 0.61–1.24)
GFR, Estimated: >60 mL/min (ref >60)
Glucose, Bld: 100 mg/dL — ABNORMAL HIGH (ref 70–99)
Potassium: 3.8 mmol/L (ref 3.5–5.1)
Sodium: 138 mmol/L (ref 135–145)
Total Bilirubin: 0.9 mg/dL (ref 0.3–1.2)
Total Protein: 6.3 g/dL — ABNORMAL LOW (ref 6.5–8.1)

## 2023-05-28 LAB — ECHO TEE

## 2023-05-28 LAB — CULTURE, BLOOD (ROUTINE X 2): Culture: NO GROWTH

## 2023-05-28 SURGERY — ECHOCARDIOGRAM, TRANSESOPHAGEAL
Anesthesia: Monitor Anesthesia Care

## 2023-05-28 MED ORDER — LIDOCAINE 2% (20 MG/ML) 5 ML SYRINGE
INTRAMUSCULAR | Status: DC | PRN
  Administered 2023-05-28 (×2): 50 mg via INTRAVENOUS

## 2023-05-28 MED ORDER — SODIUM CHLORIDE 0.9 % IV SOLN: INTRAVENOUS | Status: DC

## 2023-05-28 MED ORDER — MIDAZOLAM HCL 2 MG/2ML IJ SOLN
INTRAMUSCULAR | Status: AC
  Filled 2023-05-28: qty 2

## 2023-05-28 MED ORDER — PROPOFOL 10 MG/ML IV BOLUS
INTRAVENOUS | Status: DC | PRN
  Administered 2023-05-28: 20 mg via INTRAVENOUS
  Administered 2023-05-28: 25 mg via INTRAVENOUS
  Administered 2023-05-28: 35 mg via INTRAVENOUS

## 2023-05-28 MED ORDER — PROPOFOL 500 MG/50ML IV EMUL
INTRAVENOUS | Status: DC | PRN
  Administered 2023-05-28: 115 ug/kg/min via INTRAVENOUS

## 2023-05-28 MED ORDER — MIDAZOLAM HCL 2 MG/2ML IJ SOLN
INTRAMUSCULAR | Status: DC | PRN
  Administered 2023-05-28: 2 mg via INTRAVENOUS

## 2023-05-28 MED ORDER — SODIUM CHLORIDE 0.9 % IV SOLN: INTRAVENOUS | Status: DC | PRN

## 2023-05-28 MED ORDER — BUTAMBEN-TETRACAINE-BENZOCAINE 2-2-14 % EX AERO
INHALATION_SPRAY | CUTANEOUS | Status: DC | PRN
  Administered 2023-05-28: 1 via TOPICAL

## 2023-05-28 NOTE — Interval H&P Note (Signed)
History and Physical Interval Note:  05/28/2023 10:41 AM  Frank Bell  has presented today for surgery, with the diagnosis of bacteremia.  The various methods of treatment have been discussed with the patient and family. After consideration of risks, benefits and other options for treatment, the patient has consented to  Procedure(s): TRANSESOPHAGEAL ECHOCARDIOGRAM (N/A) as a surgical intervention.  The patient's history has been reviewed, patient examined, no change in status, stable for surgery.  I have reviewed the patient's chart and labs.  Questions were answered to the patient's satisfaction.     Goldy Calandra

## 2023-05-28 NOTE — Progress Notes (Signed)
Progress Note   Patient: Frank Bell NWG:[id number] DOB: 26-Jul-1991 DOA: 05/23/2023     5 DOS: the patient was seen and examined on 05/28/2023   Brief hospital course: Frank Bell was admitted to the hospital with the working diagnosis of sepsis with streptococcus pneumonia bacteremia.   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 Diffuse abdominal pain radiated to the back, on his initial physical examination his temp was 103.1, he had hypotension,  HR 62, RR 14, dry mucous membranes, lungs with no wheezing or rales, heart with S1 and S2 present and rhythmic, abdomen with no distention or tenderness, no lower extremity edema.  Na 135, K 3,7 Cl 101, bicarbonate 17, glucose 106, bun 18, cr 1,15  Total bilirubin 2,9, AST 55, ALT 40 Lactic acid 2,8  Wbc 29,7, hgb 15.1 plt 384   Urine analysis SG 1,023, negative protein, negative hgb and negative leukocytes.  Strep Pneumo urinary antigen positive.  #2/2 Blood cultures positive for streptococcal pneumonia x2   Chest radiograph with right rotation with no infiltrates or effusions. CT chest with negative for infiltrates.  CT abdomen and pelvis no acute changes. SP cholecystectomy and splenectomy. Fatty infiltration of the liver.   EKG 102 bpm, left axis deviation, normal intervals, sinus rhythm with no significant ST segment or T wave changes.   Patient had IV fluids and then started on norepinephrine for vasopressor support.   06/23 weaned off norepinephrine.  06/24 transferred to [facility].  06/25 orthopantogram with right upper premolar erosive changes. Transthoracic echocardiogram with no vegetations.  Follow up blood cultures with no growth.  Patient will need TEE, scheduled for later this week per cardiology.  06/26 MRI lumbar spine due to back pain with no signs of infection.  06/27 TEE  today.   Assessment and Plan: * Bacteremia Streptococcal pneumonia bacteremia. Complicated with septic shock, now resolved.   Follow up blood cultures with no growth. Wbc trending down, today at 14,3   Panoramic radiograph with right 4th pre molar erosion.  Echocardiogram (transthoracic) with no vegetations.  Lumbar spine MRI with no signs of spine infection, Plan for TEE today.   Antibiotic therapy changed to Penicillin due to elevated liver enzymes.   S/P splenectomy Positive immunosuppression.   Plan to continue antibiotic therapy.   Elevated liver enzymes Continue worsening of liver enzymes, with AST 194 and ALT 232. Bilirubins are normal range.   Last dose of acetaminophen was 06/23 and he has not required ibuprofen.  CT with fatty liver.  Case discussed with ID and pharmacy, changed antibiotic therapy to penicillin, follow up LFT in am.   Hyponatremia Hypomagnesemia.   Renal function continue to be stable, electrolytes have been corrected.  Patient is tolerating po well.   Class 2 obesity Calculated BMI is 35,8         Subjective: Patient with no chest pain or dyspnea, back pain is well controlled, no nausea or vomiting   Physical Exam: Vitals:   05/28/23 0521 05/28/23 0748 05/28/23 1003 05/28/23 1032  BP: 120/77 (!) 131/93 (!) 119/101   Pulse: (!) 52 (!) 57 (!) 51 (!) 47  Resp: 18 18 18 14   Temp: 97.8 F (36.6 C) (!) 97.4 F (36.3 C) 98.3 F (36.8 C)   TempSrc:  Oral Temporal   SpO2: 97% 98% 97% 94%  Weight:   129.3 kg  Height:   6\' 4"  (1.93 m)    Neurology awake and alert ENT with no pallor Cardiovascular with S1 and S2 present and rhythmic Respiratory with no rales or wheezing, no rhonchi Abdomen with no distention  No lower extremity edema  Data Reviewed:    Family Communication: no family at the bedside   Disposition: Status is: Inpatient Remains inpatient appropriate because: IV antibiotics, pending complete work up with TEE and  improvement in LFT.   Planned Discharge Destination: Home     Author: Coralie Keens, MD 05/28/2023 11:17 AM  For on call review [email or web address].

## 2023-05-28 NOTE — Anesthesia Preprocedure Evaluation (Addendum)
Anesthesia Evaluation  Patient identified by MRN, date of birth, ID band Patient awake    Reviewed: Allergy & Precautions, NPO status , Patient's Chart, lab work & pertinent test results  History of Anesthesia Complications Negative for: history of anesthetic complications  Airway Mallampati: II  TM Distance: >3 FB Neck ROM: Full    Dental  (+) Teeth Intact, Dental Advisory Given   Pulmonary asthma (as a child, no inhaler use as an adult) , Current Smoker and Patient abstained from smoking., former smoker   breath sounds clear to auscultation       Cardiovascular (-) hypertension Rhythm:Regular Rate:Normal  05/25/2023 ECHO: EF 50-55%, low normal LVF, normal RVF, no significant valvular abnormalities   Neuro/Psych  Headaches PSYCHIATRIC DISORDERS (ADHD)         GI/Hepatic negative GI ROS,,,(+)       alcohol useElevated LFTs Pt says rarely drinks ETOH now   Endo/Other  BMI 35  Renal/GU negative Renal ROS     Musculoskeletal   Abdominal   Peds  Hematology Hereditary spherocytosis: s/p splenectomy   Anesthesia Other Findings   Reproductive/Obstetrics                             Anesthesia Physical Anesthesia Plan  ASA: 3  Anesthesia Plan: MAC   Post-op Pain Management: Minimal or no pain anticipated   Induction:   PONV Risk Score and Plan: 0  Airway Management Planned: Natural Airway and Nasal Cannula  Additional Equipment: None  Intra-op Plan:   Post-operative Plan:   Informed Consent: I have reviewed the patients History and Physical, chart, labs and discussed the procedure including the risks, benefits and alternatives for the proposed anesthesia with the patient or authorized representative who has indicated his/her understanding and acceptance.     Dental advisory given  Plan Discussed with: CRNA and Surgeon  Anesthesia Plan Comments:        Anesthesia Quick  Evaluation

## 2023-05-28 NOTE — Anesthesia Procedure Notes (Signed)
Procedure Name: MAC Date/Time: 05/28/2023 12:45 PM  Performed by: Aundria Rud, CRNAPre-anesthesia Checklist: Patient identified, Emergency Drugs available, Suction available and Patient being monitored Patient Re-evaluated:Patient Re-evaluated prior to induction Oxygen Delivery Method: Nasal cannula

## 2023-05-28 NOTE — Transfer of Care (Signed)
Immediate Anesthesia Transfer of Care Note  Patient: Renard Hamper  Procedure(s) Performed: TRANSESOPHAGEAL ECHOCARDIOGRAM  Patient Location: Cath Lab  Anesthesia Type:MAC  Level of Consciousness: drowsy, patient cooperative, and responds to stimulation  Airway & Oxygen Therapy: Patient Spontanous Breathing and Patient connected to nasal cannula oxygen  Post-op Assessment: Report given to RN, Post -op Vital signs reviewed and stable, and Patient moving all extremities X 4  Post vital signs: Reviewed and stable  Last Vitals:  Vitals Value Taken Time  BP    Temp    Pulse 66 05/28/23 1307  Resp 19 05/28/23 1307  SpO2 89 % 05/28/23 1307  Vitals shown include unvalidated device data.  Last Pain:  Vitals:   05/28/23 1032  TempSrc:   PainSc: 0-No pain      Patients Stated Pain Goal: 0 (05/25/23 0826)  Complications: No notable events documented.

## 2023-05-28 NOTE — Anesthesia Postprocedure Evaluation (Signed)
Anesthesia Post Note  Patient: Frank Bell  Procedure(s) Performed: TRANSESOPHAGEAL ECHOCARDIOGRAM     Patient location during evaluation: Cath Lab Anesthesia Type: MAC Level of consciousness: awake and alert, patient cooperative and oriented Pain management: pain level controlled Vital Signs Assessment: post-procedure vital signs reviewed and stable Respiratory status: nonlabored ventilation, spontaneous breathing and respiratory function stable Cardiovascular status: blood pressure returned to baseline and stable Postop Assessment: no apparent nausea or vomiting and able to ambulate Anesthetic complications: no   No notable events documented.  Last Vitals:  Vitals:   05/28/23 1320 05/28/23 1330  BP: 112/66 (!) 108/58  Pulse: 60 (!) 57  Resp: 17 19  Temp: 36.7 C   SpO2: 95% 92%    Last Pain:  Vitals:   05/28/23 1330  TempSrc:   PainSc: 0-No pain                 Frank Bell,E. Fateh Kindle

## 2023-05-29 ENCOUNTER — Encounter (HOSPITAL_COMMUNITY): Payer: Self-pay | Admitting: Cardiology

## 2023-05-29 ENCOUNTER — Other Ambulatory Visit: Payer: Self-pay

## 2023-05-29 ENCOUNTER — Other Ambulatory Visit (HOSPITAL_COMMUNITY): Payer: Self-pay

## 2023-05-29 DIAGNOSIS — A403 Sepsis due to Streptococcus pneumoniae: Principal | ICD-10-CM

## 2023-05-29 DIAGNOSIS — R6520 Severe sepsis without septic shock: Secondary | ICD-10-CM

## 2023-05-29 LAB — CBC
HCT: 43.6 % (ref 39.0–52.0)
Hemoglobin: 15 g/dL (ref 13.0–17.0)
MCH: 31.6 pg (ref 26.0–34.0)
MCHC: 34.4 g/dL (ref 30.0–36.0)
MCV: 92 fL (ref 80.0–100.0)
Platelets: 509 10*3/uL — ABNORMAL HIGH (ref 150–400)
RBC: 4.74 MIL/uL (ref 4.22–5.81)
RDW: 13.1 % (ref 11.5–15.5)
WBC: 14.3 10*3/uL — ABNORMAL HIGH (ref 4.0–10.5)
nRBC: 0 % (ref 0.0–0.2)

## 2023-05-29 LAB — COMPREHENSIVE METABOLIC PANEL
ALT: 210 U/L — ABNORMAL HIGH (ref 0–44)
AST: 126 U/L — ABNORMAL HIGH (ref 15–41)
Albumin: 3 g/dL — ABNORMAL LOW (ref 3.5–5.0)
Alkaline Phosphatase: 41 U/L (ref 38–126)
Anion gap: 10 (ref 5–15)
BUN: 8 mg/dL (ref 6–20)
CO2: 25 mmol/L (ref 22–32)
Calcium: 8.8 mg/dL — ABNORMAL LOW (ref 8.9–10.3)
Chloride: 104 mmol/L (ref 98–111)
Creatinine, Ser: 1.06 mg/dL (ref 0.61–1.24)
GFR, Estimated: >60 mL/min (ref >60)
Glucose, Bld: 100 mg/dL — ABNORMAL HIGH (ref 70–99)
Potassium: 4.3 mmol/L (ref 3.5–5.1)
Sodium: 139 mmol/L (ref 135–145)
Total Bilirubin: 0.7 mg/dL (ref 0.3–1.2)
Total Protein: 6.4 g/dL — ABNORMAL LOW (ref 6.5–8.1)

## 2023-05-29 SURGERY — ECHOCARDIOGRAM, TRANSESOPHAGEAL
Anesthesia: Monitor Anesthesia Care

## 2023-05-29 MED ORDER — ACETAMINOPHEN 325 MG PO TABS: 650.0000 mg | ORAL_TABLET | Freq: Four times a day (QID) | ORAL | Status: AC | PRN

## 2023-05-29 MED ORDER — PANTOPRAZOLE SODIUM 40 MG PO TBEC
40.0000 mg | DELAYED_RELEASE_TABLET | Freq: Every day | ORAL | 0 refills | Status: AC
  Filled 2023-05-29: qty 14, 14d supply, fill #0

## 2023-05-29 MED ORDER — AMOXICILLIN 500 MG PO CAPS
1000.0000 mg | ORAL_CAPSULE | Freq: Three times a day (TID) | ORAL | Status: DC
  Administered 2023-05-29: 1000 mg via ORAL
  Filled 2023-05-29 (×2): qty 2

## 2023-05-29 MED ORDER — AMOXICILLIN 500 MG PO CAPS
1000.0000 mg | ORAL_CAPSULE | Freq: Three times a day (TID) | ORAL | 0 refills | Status: AC
  Filled 2023-05-29: qty 60, 10d supply, fill #0

## 2023-05-29 NOTE — Discharge Summary (Signed)
Physician Discharge Summary   Patient: Frank Bell MRN: [id number] DOB: [birth year]  Admit date:     05/23/2023  Discharge date: 05/29/23  Discharge Physician: Frank Bell   PCP: Frank Emery, NP   Recommendations at discharge:    Patient will continue antibiotic therapy with amoxicillin until 06/08/23. Follow up liver enzymes as outpatient in 10 days. Follow up with Drusilla Kanner NP in 7 to 10 days.  Will need vaccination for Pneumococcus, Hib, meningococcus.  Added pantoprazole for GI prophylaxis.   Discharge Diagnoses: Principal Problem:   Bacteremia Active Problems:   S/P splenectomy   Elevated liver enzymes   Hyponatremia   Class 2 obesity  Resolved Problems:   * No resolved hospital problems. [facility] Course: [name] was admitted to the hospital with the working diagnosis of sepsis with streptococcus pneumonia bacteremia.   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 Diffuse abdominal pain radiated to the back, on his initial physical examination his temp was 103.1, he had hypotension,  HR 62, RR 14, dry mucous membranes, lungs with no wheezing or rales, heart with S1 and S2 present and rhythmic, abdomen with no distention or tenderness, no lower extremity edema.  Na 135, K 3,7 Cl 101, bicarbonate 17, glucose 106, bun 18, cr 1,15  Total bilirubin 2,9, AST 55, ALT 40 Lactic acid 2,8  Wbc 29,7, hgb 15.1 plt 384   Urine analysis SG 1,023, negative protein, negative hgb and negative leukocytes.  Strep Pneumo urinary antigen positive.  #2/2 Blood cultures positive for streptococcal pneumonia.   Chest radiograph with right rotation with no infiltrates or effusions. CT chest with negative for infiltrates.  CT abdomen and pelvis no acute changes. SP cholecystectomy and splenectomy. Fatty infiltration of  the liver.   EKG 102 bpm, left axis deviation, normal intervals, sinus rhythm with no significant ST segment or T wave changes.   Patient had IV fluids and then started on norepinephrine for vasopressor support.   06/23 weaned off norepinephrine.  06/24 transferred to [facility].  06/25 orthopantogram with right upper premolar erosive changes. Transthoracic echocardiogram with no vegetations.  Follow up blood cultures with no growth.  Patient will need TEE, scheduled for later this week per cardiology.  06/26 MRI lumbar spine due to back pain with no signs of infection.  06/27 TEE with no vegetations.  06/28 patient transitioned to oral amoxicillin to complete 2 weeks from the blood cultures. Date to stop on 06/07/23.  Follow up as outpatient.   Assessment and Plan: * Bacteremia Streptococcal pneumonia bacteremia. Complicated with septic shock, now resolved.   Follow up blood cultures with no growth. He has been afebrile, and discharge wbc is 14,3  Panoramic radiograph with right 4th pre molar erosion. (Possible source of bacteremia).   Echocardiogram (transthoracic) with no vegetations.  Lumbar spine MRI with no signs of spine infection, TEE with no vegetations.   Antibiotic therapy changed to Penicillin due to elevated liver enzymes.  At the time of his discharge he will continue antibiotic therapy until 07/08 with oral amoxicillin.   S/P splenectomy Positive immunosuppression.   Plan to continue antibiotic therapy.   Elevated liver enzymes Patient was noted to have elevated liver enzymes, which were suspected to be related to ceftriaxone IV.  Antibiotic was changed to penicillin with now improvement in liver enzymes with AST down  to  126,and  ALT 210, total bilirubin 0,7.   Plan to follow up hepatic function as outpatient.   Hyponatremia Hypomagnesemia.   Electrolytes have been corrected with Na at 139, K 4,3 and serum bicarbonate at 25. Cr is 1,0 and BUN 8   Class 2  obesity Calculated BMI is 35,8          Consultants: ID,  Procedures performed: TEE   Disposition: Home Diet recommendation:  Regular diet DISCHARGE MEDICATION: Allergies as of 05/29/2023   No Known Allergies      Medication List     TAKE these medications    acetaminophen 325 MG tablet Commonly known as: TYLENOL Take 2 tablets (650 mg total) by mouth every 6 (six) hours as needed for mild pain or moderate pain.   amoxicillin 500 MG capsule Commonly known as: AMOXIL Take 2 capsules (1,000 mg total) by mouth 3 (three) times daily for 10 days.   multivitamin tablet Take 1 tablet by mouth daily.   pantoprazole 40 MG tablet Commonly known as: PROTONIX Take 1 tablet (40 mg total) by mouth daily for 14 days. Start taking on: May 30, 2023        Discharge Exam: [name] Weights   05/25/23 0500 05/26/23 0648 05/28/23 1003  Weight: 133.7 kg 128.4 kg 129.3 kg   BP 118/76 (BP Location: Left Arm)   Pulse (!) 56   Temp 97.8 F (36.6 C)   Resp 18   Ht 6\' 4"  (1.93 m)   Wt 129.3 kg   SpO2 98%   BMI 34.69 kg/m   Patient is feeling well, no chest pain, no back pain, no dyspnea.   Neurology awake and alert ENT with mild pallor Cardiovascular with S1 and S2 present and rhythmic Respiratory with no rales or wheezing Abdomen with no distention  No lower extremity edema No rashes.   Condition at discharge: stable  The results of significant diagnostics from this hospitalization (including imaging, microbiology, ancillary and laboratory) are listed below for reference.   Imaging Studies: [place] EKG SITE RITE  Result Date: 05/29/2023 If Site Rite image not attached, placement could not be confirmed due to current cardiac rhythm.  ECHO TEE  Result Date: 05/28/2023    TRANSESOPHOGEAL ECHO REPORT   Patient Name:   Frank Bell Date of Exam: 05/28/2023 Medical Rec #:  [id number]        Height:       76.0 in Accession #:    [id number]       Weight:       285.0 lb Date  of Birth:  Jul 07, 1991        BSA:          2.575 m Patient Age:    32 years         BP:           115/71 mmHg Patient Gender: M                HR:           66 bpm. Exam Location:  Inpatient Procedure: Transesophageal Echo, Color Doppler and Cardiac Doppler Indications:    Bacteremia  History:        Patient has prior history of Echocardiogram examinations, most                 recent 05/25/2023.  Sonographer:    Milda Smart Referring Phys: [id number] Perlie Gold PROCEDURE: After discussion of the risks and benefits of a  TEE, an informed consent was obtained from the patient. TEE procedure time was 8 minutes. The transesophogeal probe was passed without difficulty through the esophogus of the patient. Imaged were  obtained with the patient in a left lateral decubitus position. Local oropharyngeal anesthetic was provided with Cetacaine. Sedation performed by different physician. The patient was monitored while under deep sedation. Anesthestetic sedation was provided intravenously by Anesthesiology: 315.33mg  of Propofol, 100mg  of Lidocaine. Image quality was good. The patient's vital signs; including heart rate, blood pressure, and oxygen saturation; remained stable throughout the procedure. The patient developed no complications during the procedure.  IMPRESSIONS  1. Left ventricular ejection fraction, by estimation, is 60 to 65%. The left ventricle has normal function.  2. Right ventricular systolic function is normal. The right ventricular size is normal.  3. No left atrial/left atrial appendage thrombus was detected.  4. The mitral valve is normal in structure. Mild mitral valve regurgitation. No evidence of mitral stenosis.  5. The aortic valve is tricuspid. Aortic valve regurgitation is not visualized. No aortic stenosis is present.  6. Mildly dilated pulmonary artery. FINDINGS  Left Ventricle: Left ventricular ejection fraction, by estimation, is 60 to 65%. The left ventricle has normal function. The left  ventricular internal cavity size was normal in size. Right Ventricle: The right ventricular size is normal. No increase in right ventricular wall thickness. Right ventricular systolic function is normal. Left Atrium: Left atrial size was normal in size. No left atrial/left atrial appendage thrombus was detected. Right Atrium: Right atrial size was normal in size. Pericardium: There is no evidence of pericardial effusion. Mitral Valve: The mitral valve is normal in structure. Mild mitral valve regurgitation. No evidence of mitral valve stenosis. Tricuspid Valve: The tricuspid valve is normal in structure. Tricuspid valve regurgitation is trivial. No evidence of tricuspid stenosis. Aortic Valve: The aortic valve is tricuspid. Aortic valve regurgitation is not visualized. No aortic stenosis is present. Pulmonic Valve: The pulmonic valve was not well visualized. Pulmonic valve regurgitation is trivial. No evidence of pulmonic stenosis. Aorta: The aortic root and ascending aorta are structurally normal, with no evidence of dilitation. There is minimal (Grade I) plaque. Pulmonary Artery: The pulmonary artery is mildly dilated. Venous: The left upper pulmonary vein, left lower pulmonary vein, right upper pulmonary vein and right lower pulmonary vein are normal. A normal flow pattern is recorded from the left upper pulmonary vein. IAS/Shunts: No atrial level shunt detected by color flow Doppler. Additional Comments: Spectral Doppler performed. LEFT VENTRICLE PLAX 2D LVOT diam:     2.80 cm LVOT Area:     6.16 cm   AORTA Ao Root diam: 3.50 cm Ao Asc diam:  3.30 cm  SHUNTS Systemic Diam: 2.80 cm Kardie Tobb DO Electronically signed by Thomasene Ripple DO Signature Date/Time: 05/28/2023/4:07:33 PM    Final    EP STUDY  Result Date: 05/28/2023 See surgical note for result.  MR Lumbar Spine W Wo Contrast  Result Date: 05/27/2023 CLINICAL DATA:  Episodes of sepsis with abdominal and back pain. Osteomyelitis suspected. EXAM: MRI  LUMBAR SPINE WITHOUT AND WITH CONTRAST TECHNIQUE: Multiplanar and multiecho pulse sequences of the lumbar spine were obtained without and with intravenous contrast. CONTRAST:  10mL GADAVIST GADOBUTROL 1 MMOL/ML IV SOLN COMPARISON:  CT abdomen 4 days ago FINDINGS: Segmentation:  5 lumbar type vertebral bodies. Alignment:  Mild scoliotic curvature convex to the left. Vertebrae: No sign of fracture. No evidence of active bone, disc or joint infection. Mild edematous endplate changes at  L2-3, L3-4 and L4-5 which could relate to back pain. Conus medullaris and cauda equina: Conus extends to the L1 level. Conus and cauda equina appear normal. Paraspinal and other soft tissues: Negative Disc levels: T11-12: Chronic disc degeneration with endplate osteophytes and bulging of the disc. No compressive stenosis. T12-L1 and L1-2: Normal L2-3: Disc degeneration with loss of disc height. Endplate osteophytes. Left foraminal to extraforaminal disc protrusion. No central canal stenosis. Left foraminal encroachment could affect the L2 nerve. As noted above, there are some discogenic endplate marrow changes which could relate to regional pain. L3-4: Disc degeneration with loss of disc height. Endplate osteophytes. Mild facet and ligamentous hypertrophy. No compressive stenosis. Discogenic endplate marrow changes which could relate to regional pain. L4-5: Disc degeneration with loss of disc height. Endplate osteophytes and shallow protrusion of the disc. Mild facet and ligamentous hypertrophy. Narrowing of the lateral recesses which could possibly be symptomatic. Mild right foraminal stenosis. Discogenic endplate marrow changes which could relate to regional pain. The facet arthritis could also be painful. L5-S1: Normal appearance of the disc. Bilateral facet osteoarthritis. No canal or foraminal stenosis. The arthritis could contribute to pain. IMPRESSION: 1. No evidence of active bone, disc or joint infection. 2. 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 At L2-3, there is a left foraminal to extraforaminal disc protrusion that could affect the left L2 nerve. At L4-5, there is mild stenosis of the lateral recesses and foramina on the right. 3. Facet osteoarthritis at L5-S1 which could contribute to back pain. Electronically Signed   By: Paulina Fusi M.D.   On: 05/27/2023 11:01   ECHOCARDIOGRAM COMPLETE  Result Date: 05/25/2023    ECHOCARDIOGRAM REPORT   Patient Name:   Frank Bell Date of Exam: 05/25/2023 Medical Rec #:  [id number]        Height:       76.0 in Accession #:    [id number]       Weight:       294.8 lb Date of Birth:  [birth year]        BSA:          2.612 m Patient Age:    32 years         BP:           118/77 mmHg Patient Gender: M                HR:           51 bpm. Exam Location:  Inpatient Procedure: 2D Echo, Cardiac Doppler and Color Doppler Indications:    Sepsis  History:        Patient has no prior history of Echocardiogram examinations.  Sonographer:    Lucendia Herrlich Referring Phys: [phone] WHITNEY D HARRIS IMPRESSIONS  1. Left ventricular ejection fraction, by estimation, is 50 to 55%. The left ventricle has low normal function. The left ventricle has no regional wall motion abnormalities. Left ventricular diastolic parameters were normal.  2. Right ventricular systolic function is normal. The right ventricular size is mildly enlarged. There is normal pulmonary artery systolic pressure.  3. The mitral valve is normal in structure. No evidence of mitral valve regurgitation. No evidence of mitral stenosis.  4. The aortic valve is tricuspid. Aortic valve regurgitation is not visualized. No aortic stenosis is present.  5. The inferior vena cava is dilated in size with <50% respiratory variability, suggesting right atrial pressure of 15  mmHg. Comparison(s): No prior Echocardiogram. Conclusion(s)/Recommendation(s): Normal biventricular  function without evidence of hemodynamically significant valvular heart disease. FINDINGS  Left Ventricle: Left ventricular ejection fraction, by estimation, is 50 to 55%. The left ventricle has low normal function. The left ventricle has no regional wall motion abnormalities. The left ventricular internal cavity size was normal in size. There is no left ventricular hypertrophy. Left ventricular diastolic parameters were normal. Right Ventricle: The right ventricular size is mildly enlarged. No increase in right ventricular wall thickness. Right ventricular systolic function is normal. There is normal pulmonary artery systolic pressure. The tricuspid regurgitant velocity is 1.85  m/s, and with an assumed right atrial pressure of 15 mmHg, the estimated right ventricular systolic pressure is 28.7 mmHg. Left Atrium: Left atrial size was normal in size. Right Atrium: Right atrial size was normal in size. Pericardium: There is no evidence of pericardial effusion. Mitral Valve: The mitral valve is normal in structure. No evidence of mitral valve regurgitation. No evidence of mitral valve stenosis. Tricuspid Valve: The tricuspid valve is normal in structure. Tricuspid valve regurgitation is trivial. No evidence of tricuspid stenosis. Aortic Valve: The aortic valve is tricuspid. Aortic valve regurgitation is not visualized. No aortic stenosis is present. Aortic valve peak gradient measures 5.2 mmHg. Pulmonic Valve: The pulmonic valve was grossly normal. Pulmonic valve regurgitation is not visualized. No evidence of pulmonic stenosis. Aorta: The aortic root, ascending aorta, aortic arch and descending aorta are all structurally normal, with no evidence of dilitation or obstruction. Venous: The inferior vena cava is dilated in size with less than 50% respiratory variability, suggesting right atrial pressure of 15 mmHg. IAS/Shunts: The atrial septum is grossly normal.  LEFT VENTRICLE PLAX 2D LVIDd:         5.20 cm   Diastology  LVIDs:         3.80 cm   LV e' medial:    12.70 cm/s LV PW:         1.10 cm   LV E/e' medial:  4.7 LV IVS:        0.80 cm   LV e' lateral:   13.80 cm/s LVOT diam:     2.60 cm   LV E/e' lateral: 4.3 LV SV:         118 LV SV Index:   45 LVOT Area:     5.31 cm                           3D Volume EF:                          3D EF:        53 %                          LV EDV:       228 ml                          LV ESV:       106 ml                          LV SV:        122 ml RIGHT VENTRICLE             IVC RV S prime:     10.00  cm/s  IVC diam: 2.30 cm TAPSE (M-mode): 1.9 cm LEFT ATRIUM           Index        RIGHT ATRIUM           Index LA diam:      4.10 cm 1.57 cm/m   RA Area:     20.10 cm LA Vol (A2C): 68.8 ml 26.34 ml/m  RA Volume:   59.90 ml  22.93 ml/m LA Vol (A4C): 72.8 ml 27.87 ml/m  AORTIC VALVE AV Area (Vmax): 4.68 cm AV Vmax:        114.00 cm/s AV Peak Grad:   5.2 mmHg LVOT Vmax:      100.40 cm/s LVOT Vmean:     65.033 cm/s LVOT VTI:       0.222 m  AORTA Ao Asc diam: 3.40 cm MITRAL VALVE               TRICUSPID VALVE MV Area (PHT): 3.42 cm    TR Peak grad:   13.7 mmHg MV Decel Time: 222 msec    TR Vmax:        185.00 cm/s MV E velocity: 59.90 cm/s MV A velocity: 56.40 cm/s  SHUNTS MV E/A ratio:  1.06        Systemic VTI:  0.22 m                            Systemic Diam: 2.60 cm Jodelle Red MD Electronically signed by Jodelle Red MD Signature Date/Time: 05/25/2023/4:30:31 PM    Final    DG Orthopantogram  Result Date: 05/25/2023 CLINICAL DATA:  Sepsis EXAM: ORTHOPANTOGRAM/PANORAMIC COMPARISON:  None Available. FINDINGS: Panorex view of the mandible was performed. There is marked erosion of the right upper premolar (tooth 4). No other dental erosions are identified. No acute or destructive bony abnormalities. IMPRESSION: 1. Marked erosive change within the right upper premolar (tooth 4). Electronically Signed   By: Sharlet Salina M.D.   On: 05/25/2023 14:34   CT ABDOMEN  PELVIS W CONTRAST  Result Date: 05/24/2023 CLINICAL DATA:  Sepsis, low back paraspinal tenderness. EXAM: CT ABDOMEN AND PELVIS WITH CONTRAST TECHNIQUE: Multidetector CT imaging of the abdomen and pelvis was performed using the standard protocol following bolus administration of intravenous contrast. RADIATION DOSE REDUCTION: This exam was performed according to the departmental dose-optimization program which includes automated exposure control, adjustment of the mA and/or kV according to patient size and/or use of iterative reconstruction technique. CONTRAST:  75mL OMNIPAQUE IOHEXOL 350 MG/ML SOLN COMPARISON:  05/23/2023. FINDINGS: Lower chest: Dependent atelectasis is present bilaterally. Hepatobiliary: No focal liver abnormality is seen. There is mild fatty infiltration of the liver. Status post cholecystectomy. No biliary dilatation. Pancreas: Unremarkable. No pancreatic ductal dilatation or surrounding inflammatory changes. Spleen: The spleen is surgically absent. Adrenals/Urinary Tract: The adrenal glands are within normal limits. The kidneys enhance symmetrically. No renal calculus or hydronephrosis. The bladder is unremarkable. Stomach/Bowel: Stomach is within normal limits. Appendix appears normal. No evidence of bowel wall thickening, distention, or inflammatory changes. No free air or pneumatosis. Vascular/Lymphatic: No significant vascular findings are present. No enlarged abdominal or pelvic lymph nodes. Reproductive: Prostate is unremarkable. Other: No abdominopelvic ascites. A fat containing umbilical hernia is present. Musculoskeletal: Degenerative changes are present in the thoracolumbar spine. No acute osseous abnormality. IMPRESSION: 1. No abnormality to explain reported sepsis. 2. Fatty infiltration of the liver. Electronically Signed   By: Vernona Rieger  Ladona Ridgel M.D.   On: 05/24/2023 00:11   CT CHEST ABDOMEN PELVIS W CONTRAST  Result Date: 05/23/2023 CLINICAL DATA:  Sepsis, abdominal pain EXAM: CT  CHEST, ABDOMEN, AND PELVIS WITH CONTRAST TECHNIQUE: Multidetector CT imaging of the chest, abdomen and pelvis was performed following the bolus administration of intravenous contrast. RADIATION DOSE REDUCTION: This exam was performed according to the departmental dose-optimization program which includes automated exposure control, adjustment of the mA and/or kV according to patient size and/or use of iterative reconstruction technique. CONTRAST:  75mL OMNIPAQUE IOHEXOL 350 MG/ML SOLN COMPARISON:  None Available. FINDINGS: Contrast administration is documented by technologist, however there is no evident contrast opacification of the chest, abdomen, or pelvis. CT CHEST FINDINGS Cardiovascular: No significant vascular findings. Normal heart size. No pericardial effusion. Mediastinum/Nodes: No enlarged mediastinal, hilar, or axillary lymph nodes. Thyroid gland, trachea, and esophagus demonstrate no significant findings. Lungs/Pleura: Lungs are clear. No pleural effusion or pneumothorax. Musculoskeletal: Bilateral gynecomastia.  No acute osseous findings. CT ABDOMEN PELVIS FINDINGS Hepatobiliary: No focal liver abnormality is seen. Hepatomegaly, maximum coronal span 21.0 cm. Hepatic steatosis. Status post cholecystectomy. No biliary dilatation. Pancreas: Unremarkable. No pancreatic ductal dilatation or surrounding inflammatory changes. Spleen: Status post splenectomy. Adrenals/Urinary Tract: Adrenal glands are unremarkable. Kidneys are normal, without renal calculi, solid lesion, or hydronephrosis. Bladder is unremarkable. Stomach/Bowel: Stomach is within normal limits. Appendix appears normal. No evidence of bowel wall thickening, distention, or inflammatory changes. Vascular/Lymphatic: No significant vascular findings are present. No enlarged abdominal or pelvic lymph nodes. Reproductive: No mass or other abnormality. Other: Small fat containing umbilical hernia.  No ascites. Musculoskeletal: No acute osseous  findings. IMPRESSION: 1. Contrast administration is documented by technologist, however there is no evident contrast opacification of the chest, abdomen, or pelvis on examination is submitted for review. This could be due to contrast extravasation or other technical failure. Please see technologist documentation further information. 2. Within this limitation, no acute noncontrast CT findings of the chest, abdomen, or pelvis to explain sepsis. 3. Hepatomegaly and hepatic steatosis. 4. Status post cholecystectomy and splenectomy. Electronically Signed   By: Jearld Lesch M.D.   On: 05/23/2023 15:28   DG Chest Port 1 View  Result Date: 05/23/2023 CLINICAL DATA:  Weakness. EXAM: PORTABLE CHEST 1 VIEW COMPARISON:  February 16, 2016. FINDINGS: Mild cardiomegaly is noted. Hypoinflation of the lungs is noted with minimal bibasilar subsegmental atelectasis. Bony thorax is unremarkable. IMPRESSION: Hypoinflation of the lungs with minimal bibasilar subsegmental atelectasis. Electronically Signed   By: Lupita Raider M.D.   On: 05/23/2023 14:23    Microbiology: Results for orders placed or performed during the hospital encounter of 05/23/23  Culture, blood (routine x 2)     Status: Abnormal   Collection Time: 05/23/23  3:04 PM   Specimen: BLOOD RIGHT FOREARM  Result Value Ref Range Status   Specimen Description BLOOD RIGHT FOREARM  Final   Special Requests   Final    BOTTLES DRAWN AEROBIC AND ANAEROBIC Blood Culture results may not be optimal due to an excessive volume of blood received in culture bottles   Culture  Setup Time   Final    GRAM POSITIVE COCCI ANAEROBIC BOTTLE ONLY CRITICAL RESULT CALLED TO, READ BACK BY AND VERIFIED WITH: PHARMD V. BRYK 05/24/23 @ 0709 BY AB Performed at [facility], [address] [place], [place] [address]    Culture STREPTOCOCCUS PNEUMONIAE (A)  Final   Report Status 05/26/2023 FINAL  Final   Organism ID, Bacteria STREPTOCOCCUS PNEUMONIAE  Final       Susceptibility   Streptococcus pneumoniae - MIC*    ERYTHROMYCIN <=0.12 SENSITIVE Sensitive     LEVOFLOXACIN 0.5 SENSITIVE Sensitive     VANCOMYCIN 0.25 SENSITIVE Sensitive     PENICILLIN (meningitis) <=0.06 SENSITIVE Sensitive     PENO - penicillin <=0.06      PENICILLIN (non-meningitis) <=0.06 SENSITIVE Sensitive     PENICILLIN (oral) <=0.06 SENSITIVE Sensitive     CEFTRIAXONE (non-meningitis) <=0.12 SENSITIVE Sensitive     CEFTRIAXONE (meningitis) <=0.12 SENSITIVE Sensitive     * STREPTOCOCCUS PNEUMONIAE  Blood Culture ID Panel (Reflexed)     Status: Abnormal   Collection Time: 05/23/23  3:04 PM  Result Value Ref Range Status   Enterococcus faecalis NOT DETECTED NOT DETECTED Final   Enterococcus Faecium NOT DETECTED NOT DETECTED Final   Listeria monocytogenes NOT DETECTED NOT DETECTED Final   Staphylococcus species NOT DETECTED NOT DETECTED Final   Staphylococcus aureus (BCID) NOT DETECTED NOT DETECTED Final   Staphylococcus epidermidis NOT DETECTED NOT DETECTED Final   Staphylococcus lugdunensis NOT DETECTED NOT DETECTED Final   Streptococcus species DETECTED (A) NOT DETECTED Final    Comment: CRITICAL RESULT CALLED TO, READ BACK BY AND VERIFIED WITH: PHARMD V. BRYK 05/24/23 @ 0709 BY AB    Streptococcus agalactiae NOT DETECTED NOT DETECTED Final   Streptococcus pneumoniae DETECTED (A) NOT DETECTED Final    Comment: CRITICAL RESULT CALLED TO, READ BACK BY AND VERIFIED WITH: PHARMD V. BRYK 05/24/23 @ 0709 BY AB    Streptococcus pyogenes NOT DETECTED NOT DETECTED Final   A.calcoaceticus-baumannii NOT DETECTED NOT DETECTED Final   Bacteroides fragilis NOT DETECTED NOT DETECTED Final   Enterobacterales NOT DETECTED NOT DETECTED Final   Enterobacter cloacae complex NOT DETECTED NOT DETECTED Final   Escherichia coli NOT DETECTED NOT DETECTED Final   Klebsiella aerogenes NOT DETECTED NOT DETECTED Final   Klebsiella oxytoca NOT DETECTED NOT DETECTED Final   Klebsiella pneumoniae NOT  DETECTED NOT DETECTED Final   Proteus species NOT DETECTED NOT DETECTED Final   Salmonella species NOT DETECTED NOT DETECTED Final   Serratia marcescens NOT DETECTED NOT DETECTED Final   Haemophilus influenzae NOT DETECTED NOT DETECTED Final   Neisseria meningitidis NOT DETECTED NOT DETECTED Final   Pseudomonas aeruginosa NOT DETECTED NOT DETECTED Final   Stenotrophomonas maltophilia NOT DETECTED NOT DETECTED Final   Candida albicans NOT DETECTED NOT DETECTED Final   Candida auris NOT DETECTED NOT DETECTED Final   Candida glabrata NOT DETECTED NOT DETECTED Final   Candida krusei NOT DETECTED NOT DETECTED Final   Candida parapsilosis NOT DETECTED NOT DETECTED Final   Candida tropicalis NOT DETECTED NOT DETECTED Final   Cryptococcus neoformans/gattii NOT DETECTED NOT DETECTED Final    Comment: Performed at [facility], [address] [place], [place] [address]  Respiratory (~20 pathogens) panel by PCR     Status: None   Collection Time: 05/23/23  4:55 PM   Specimen: Nasopharyngeal Swab; Respiratory  Result Value Ref Range Status   Adenovirus NOT DETECTED NOT DETECTED Final   Coronavirus 229E NOT DETECTED NOT DETECTED Final    Comment: (NOTE) The Coronavirus on the Respiratory Panel, DOES NOT test for the novel  Coronavirus (2019 nCoV)    Coronavirus HKU1 NOT DETECTED NOT DETECTED Final   Coronavirus NL63 NOT DETECTED NOT DETECTED Final   Coronavirus OC43 NOT DETECTED NOT DETECTED Final   Metapneumovirus NOT DETECTED NOT DETECTED Final   Rhinovirus / Enterovirus NOT DETECTED NOT  DETECTED Final   Influenza A NOT DETECTED NOT DETECTED Final   Influenza B NOT DETECTED NOT DETECTED Final   Parainfluenza Virus 1 NOT DETECTED NOT DETECTED Final   Parainfluenza Virus 2 NOT DETECTED NOT DETECTED Final   Parainfluenza Virus 3 NOT DETECTED NOT DETECTED Final   Parainfluenza Virus 4 NOT DETECTED NOT DETECTED Final   Respiratory Syncytial Virus NOT DETECTED NOT DETECTED Final    Bordetella pertussis NOT DETECTED NOT DETECTED Final   Bordetella Parapertussis NOT DETECTED NOT DETECTED Final   Chlamydophila pneumoniae NOT DETECTED NOT DETECTED Final   Mycoplasma pneumoniae NOT DETECTED NOT DETECTED Final    Comment: Performed at [facility], [address], [place], [place] [address]  Culture, blood (routine x 2)     Status: Abnormal   Collection Time: 05/23/23  5:41 PM   Specimen: BLOOD RIGHT ARM  Result Value Ref Range Status   Specimen Description BLOOD RIGHT ARM  Final   Special Requests   Final    BOTTLES DRAWN AEROBIC AND ANAEROBIC Blood Culture results may not be optimal due to an excessive volume of blood received in culture bottles   Culture  Setup Time   Final    GRAM POSITIVE COCCI IN BOTH AEROBIC AND ANAEROBIC BOTTLES CRITICAL VALUE NOTED.  VALUE IS CONSISTENT WITH PREVIOUSLY REPORTED AND CALLED VALUE.    Culture (A)  Final    STREPTOCOCCUS PNEUMONIAE SUSCEPTIBILITIES PERFORMED ON PREVIOUS CULTURE WITHIN THE LAST 5 DAYS. Performed at [facility], [address], [place], [place] [address]    Report Status 05/26/2023 FINAL  Final  Expectorated Sputum Assessment w Gram Stain, Rflx to Resp Cult     Status: None   Collection Time: 05/23/23  6:06 PM   Specimen: Expectorated Sputum  Result Value Ref Range Status   Specimen Description EXPECTORATED SPUTUM  Final   Special Requests NONE  Final   Sputum evaluation   Final    THIS SPECIMEN IS ACCEPTABLE FOR SPUTUM CULTURE Performed at [facility], [address] [place], [place] [address]    Report Status 05/23/2023 FINAL  Final  Culture, Respiratory w Gram Stain     Status: None   Collection Time: 05/23/23  6:06 PM  Result Value Ref Range Status   Specimen Description EXPECTORATED SPUTUM  Final   Special Requests NONE Reflexed from [id number]  Final   Gram Stain   Final    RARE WBC PRESENT, PREDOMINANTLY PMN MODERATE GRAM POSITIVE COCCI MODERATE GRAM NEGATIVE RODS    Culture    Final    ABUNDANT Consistent with normal respiratory flora. Performed at [facility], [address] [place], [place] [address]    Report Status 05/26/2023 FINAL  Final  MRSA Next Gen by PCR, Nasal     Status: None   Collection Time: 05/23/23  6:08 PM   Specimen: Nasal Mucosa; Nasal Swab  Result Value Ref Range Status   MRSA by PCR Next Gen NOT DETECTED NOT DETECTED Final    Comment: (NOTE) The GeneXpert MRSA Assay (FDA approved for NASAL specimens only), is one component of a comprehensive MRSA colonization surveillance program. It is not intended to diagnose MRSA infection nor to guide or monitor treatment for MRSA infections. Test performance is not FDA approved in patients less than 109 years old. Performed at [facility], [address], [place], [place] [address]   Culture, blood (Routine X 2) w Reflex to ID Panel  Status: None (Preliminary result)   Collection Time: 05/25/23  6:54 AM   Specimen: BLOOD  Result Value Ref Range Status   Specimen Description BLOOD RIGHT ANTECUBITAL  Final   Special Requests   Final    BOTTLES DRAWN AEROBIC AND ANAEROBIC Blood Culture adequate volume   Culture   Final    NO GROWTH 4 DAYS Performed at [facility], [address] [place], [place] [address]    Report Status PENDING  Incomplete  Culture, blood (Routine X 2) w Reflex to ID Panel     Status: None (Preliminary result)   Collection Time: 05/25/23  6:54 AM   Specimen: BLOOD RIGHT HAND  Result Value Ref Range Status   Specimen Description BLOOD RIGHT HAND  Final   Special Requests   Final    BOTTLES DRAWN AEROBIC AND ANAEROBIC Blood Culture adequate volume   Culture   Final    NO GROWTH 4 DAYS Performed at [facility], [address] [place], [place] [address]    Report Status PENDING  Incomplete    Labs: CBC: Recent Labs  Lab 05/24/23 0110 05/26/23 0420 05/27/23 0425 05/28/23 0704 05/29/23 0642  WBC 52.2* 15.3* 12.3* 14.1* 14.3*   NEUTROABS  --  9.9* 6.5  --   --   HGB 13.2 14.4 14.4 15.3 15.0  HCT 37.8* 41.6 40.8 43.8 43.6  MCV 91.7 94.1 92.9 93.4 92.0  PLT 322 350 418* 472* 509*   Basic Metabolic Panel: Recent Labs  Lab 05/24/23 0110 05/26/23 0420 05/27/23 0425 05/28/23 0704 05/29/23 0642  NA 134* 138 136 138 139  K 3.9 3.9 3.7 3.8 4.3  CL 108 104 103 105 104  CO2 18* 24 24 26 25   GLUCOSE 110* 79 85 100* 100*  BUN 17 6 10 9 8   CREATININE 1.09 0.91 1.01 0.95 1.06  CALCIUM 7.7* 8.6* 8.4* 8.6* 8.8*  MG 1.5*  --   --   --   --   PHOS 3.6  --   --   --   --    Liver Function Tests: Recent Labs  Lab 05/24/23 0110 05/26/23 0420 05/27/23 0425 05/28/23 0704 05/29/23 0642  AST 42* 45* 176* 194* 126*  ALT 35 54* 150* 232* 210*  ALKPHOS 35* 52 45 44 41  BILITOT 1.7* 1.0 0.9 0.9 0.7  PROT 5.9* 6.1* 6.1* 6.3* 6.4*  ALBUMIN 2.9* 2.9* 2.9* 3.0* 3.0*   CBG: Recent Labs  Lab 05/23/23 1745  GLUCAP 109*    Discharge time spent: greater than 30 minutes.  Signed: Coralie Keens, MD [facility] 05/29/2023

## 2023-05-29 NOTE — Progress Notes (Signed)
[facility]  Date of Admission:  05/23/2023   Total days of inpatient antibiotics 3  Principal Problem:   Bacteremia Active Problems:   S/P splenectomy   Elevated liver enzymes   Class 2 obesity   Hyponatremia          Assessment: 32 year old male with history of asthma, alcohol abuse, vaping, history spherocytosis s/p splenectomy with recurrent episodes of sepsis admitted with: #Septic shock secondary to strep pneumo bacteremia patient presented with nausea, vomiting, diffuse abdominal pain fevers and chills with generalized aches and pains.  Found to have WBC of 52 K, Tmax 103.1.  Blood cultures from admission grew strep pneumoniae -CT chest abdomen pelvis with contrast hepatomegaly and hepatic steatosis, cholecystectomy and splenectomy - Workup with CT abdomen pelvis showed fatty infiltration of the liver.  - Reported some tooth pain as-orthopantogram ordered which showed marked erosive changes within the right upper premolar - Distance history of snorting cocaine. - Respiratory cultures on 6/22 showed normal respiratory flora, RVP negative. -TTE negative for vegetation, TEE no veg. MRI lumbar sign no osteo/infection. Recommendations: -D/C penicillin IV - Start amoxicillin 1 g 3 times daily to complete 2 weeks antibiotics from the blood cultures EOT 7/7 - Patient needs vaccines below up-to-date given splenectomy, can follow-up with PCP to ensure up-to-date vaccines: Pneumococcal vaccine,Hib, meningococcal vaccine. - ID will sign off  Microbiology:   Antibiotics: Vancomycin 6/22 - 6/23 Ceftriaxone 6/23-present 6/22 cefepime Metronidazole 6/22 Cultures: Blood 6/20 to 2/2 strep pneumoniae 6/24 no growth    SUBJECTIVE: No new complaints.  Interval: Afebrile overnight, WBC 14k  Review of Systems: Review of Systems  All other systems reviewed and are negative.    Scheduled Meds:  amoxicillin  1,000 mg Oral Q8H   Chlorhexidine  Gluconate Cloth  6 each Topical Daily   enoxaparin (LOVENOX) injection  40 mg Subcutaneous Q24H   lidocaine  1 patch Transdermal Q24H   nicotine  21 mg Transdermal Daily   pantoprazole  40 mg Oral Daily   Continuous Infusions:  sodium chloride Stopped (05/24/23 1816)   PRN Meds:.acetaminophen, docusate sodium, HYDROmorphone (DILAUDID) injection, ibuprofen, polyethylene glycol No Known Allergies  OBJECTIVE: Vitals:   05/28/23 1330 05/28/23 1621 05/28/23 2009 05/29/23 0836  BP: (!) 108/58 126/79 115/74 118/76  Pulse: (!) 57 (!) 51 (!) 56 (!) 56  Resp: 19 17 18    Temp:  (!) 97.5 F (36.4 C) 98.2 F (36.8 C) 97.8 F (36.6 C)  TempSrc:  Oral Oral   SpO2: 92% 100% 100% 98%  Weight:      Height:       Body mass index is 34.69 kg/m.  Physical Exam Constitutional:      General: He is not in acute distress.    Appearance: He is normal weight. He is not toxic-appearing.  HENT:     Head: Normocephalic and atraumatic.     Right Ear: External ear normal.     Left Ear: External ear normal.     Nose: No congestion or rhinorrhea.     Mouth/Throat:     Mouth: Mucous membranes are moist.     Pharynx: Oropharynx is clear.  Eyes:     Extraocular Movements: Extraocular movements intact.     Conjunctiva/sclera: Conjunctivae normal.     Pupils: Pupils are equal, round, and reactive to light.  Cardiovascular:     Rate and Rhythm: Normal rate and regular rhythm.     Heart sounds: No  murmur heard.    No friction rub. No gallop.  Pulmonary:     Effort: Pulmonary effort is normal.     Breath sounds: Normal breath sounds.  Abdominal:     General: Abdomen is flat. Bowel sounds are normal.     Palpations: Abdomen is soft.  Musculoskeletal:        General: No swelling. Normal range of motion.     Cervical back: Normal range of motion and neck supple.  Skin:    General: Skin is warm and dry.  Neurological:     General: No focal deficit present.     Mental Status: He is oriented to  person, place, and time.  Psychiatric:        Mood and Affect: Mood normal.       Lab Results Lab Results  Component Value Date   WBC 14.3 (H) 05/29/2023   HGB 15.0 05/29/2023   HCT 43.6 05/29/2023   MCV 92.0 05/29/2023   PLT 509 (H) 05/29/2023    Lab Results  Component Value Date   CREATININE 1.06 05/29/2023   BUN 8 05/29/2023   NA 139 05/29/2023   K 4.3 05/29/2023   CL 104 05/29/2023   CO2 25 05/29/2023    Lab Results  Component Value Date   ALT 210 (H) 05/29/2023   AST 126 (H) 05/29/2023   ALKPHOS 41 05/29/2023   BILITOT 0.7 05/29/2023        Danelle Earthly, MD [facility] 05/29/2023, 12:34 PM   I have personally spent 52 resting in bed.  No new complaints.  Minutes involved in face-to-face and non-face-to-face activities for this patient on the day of the visit. Professional time spent includes the following activities: Preparing to see the patient (review of tests), Obtaining and/or reviewing separately obtained history (admission/discharge record), Performing a medically appropriate examination and/or evaluation , Ordering medications/tests/procedures, referring and communicating with other health care professionals, Documenting clinical information in the EMR, Independently interpreting results (not separately reported), Communicating results to the patient/family/caregiver, Counseling and educating the patient/family/caregiver and Care coordination (not separately reported).

## 2023-05-30 LAB — CULTURE, BLOOD (ROUTINE X 2): Special Requests: ADEQUATE
